# Patient Record
Sex: Male | Born: 1965 | Race: White | Hispanic: No | Marital: Married | Smoking: Current every day smoker
Health system: Southern US, Community
[De-identification: ages and names within clinical notes are randomized; demographics above are authoritative.]

## PROBLEM LIST (undated history)

## (undated) DIAGNOSIS — K649 Unspecified hemorrhoids: Secondary | ICD-10-CM

## (undated) DIAGNOSIS — Z9189 Other specified personal risk factors, not elsewhere classified: Secondary | ICD-10-CM

## (undated) DIAGNOSIS — A630 Anogenital (venereal) warts: Secondary | ICD-10-CM

## (undated) DIAGNOSIS — K219 Gastro-esophageal reflux disease without esophagitis: Secondary | ICD-10-CM

## (undated) DIAGNOSIS — F1020 Alcohol dependence, uncomplicated: Secondary | ICD-10-CM

## (undated) DIAGNOSIS — K921 Melena: Secondary | ICD-10-CM

## (undated) DIAGNOSIS — B019 Varicella without complication: Secondary | ICD-10-CM

## (undated) DIAGNOSIS — M1990 Unspecified osteoarthritis, unspecified site: Secondary | ICD-10-CM

## (undated) HISTORY — DX: Unspecified hemorrhoids: K64.9

## (undated) HISTORY — PX: CHOLECYSTECTOMY, LAPAROSCOPIC: SHX56

## (undated) HISTORY — DX: Unspecified osteoarthritis, unspecified site: M19.90

## (undated) HISTORY — DX: Anogenital (venereal) warts: A63.0

## (undated) HISTORY — DX: Other specified personal risk factors, not elsewhere classified: Z91.89

## (undated) HISTORY — PX: OTHER SURGICAL HISTORY: SHX169

## (undated) HISTORY — PX: BACK SURGERY: SHX140

## (undated) HISTORY — DX: Gastro-esophageal reflux disease without esophagitis: K21.9

## (undated) HISTORY — DX: Varicella without complication: B01.9

## (undated) HISTORY — DX: Alcohol dependence, uncomplicated: F10.20

## (undated) HISTORY — DX: Melena: K92.1

---

## 2005-04-21 ENCOUNTER — Ambulatory Visit: Payer: Self-pay | Admitting: *Deleted

## 2006-03-12 ENCOUNTER — Inpatient Hospital Stay (HOSPITAL_COMMUNITY): Admission: RE | Admit: 2006-03-12 | Discharge: 2006-03-13 | Payer: Self-pay | Admitting: Specialist

## 2006-09-10 IMAGING — CR DG LUMBAR SPINE 2-3V
2 series · 2 of 2 positions shown · non-contrast
Comparison: none

CLINICAL DATA: Back pain.  Preop respiratory exam. 
 LUMBAR SPINE ? 2 VIEW: 
 Correlation is made with chest radiographs done the same date.  There are no other prior studies available for correlation.

[t l-spine a.p.]
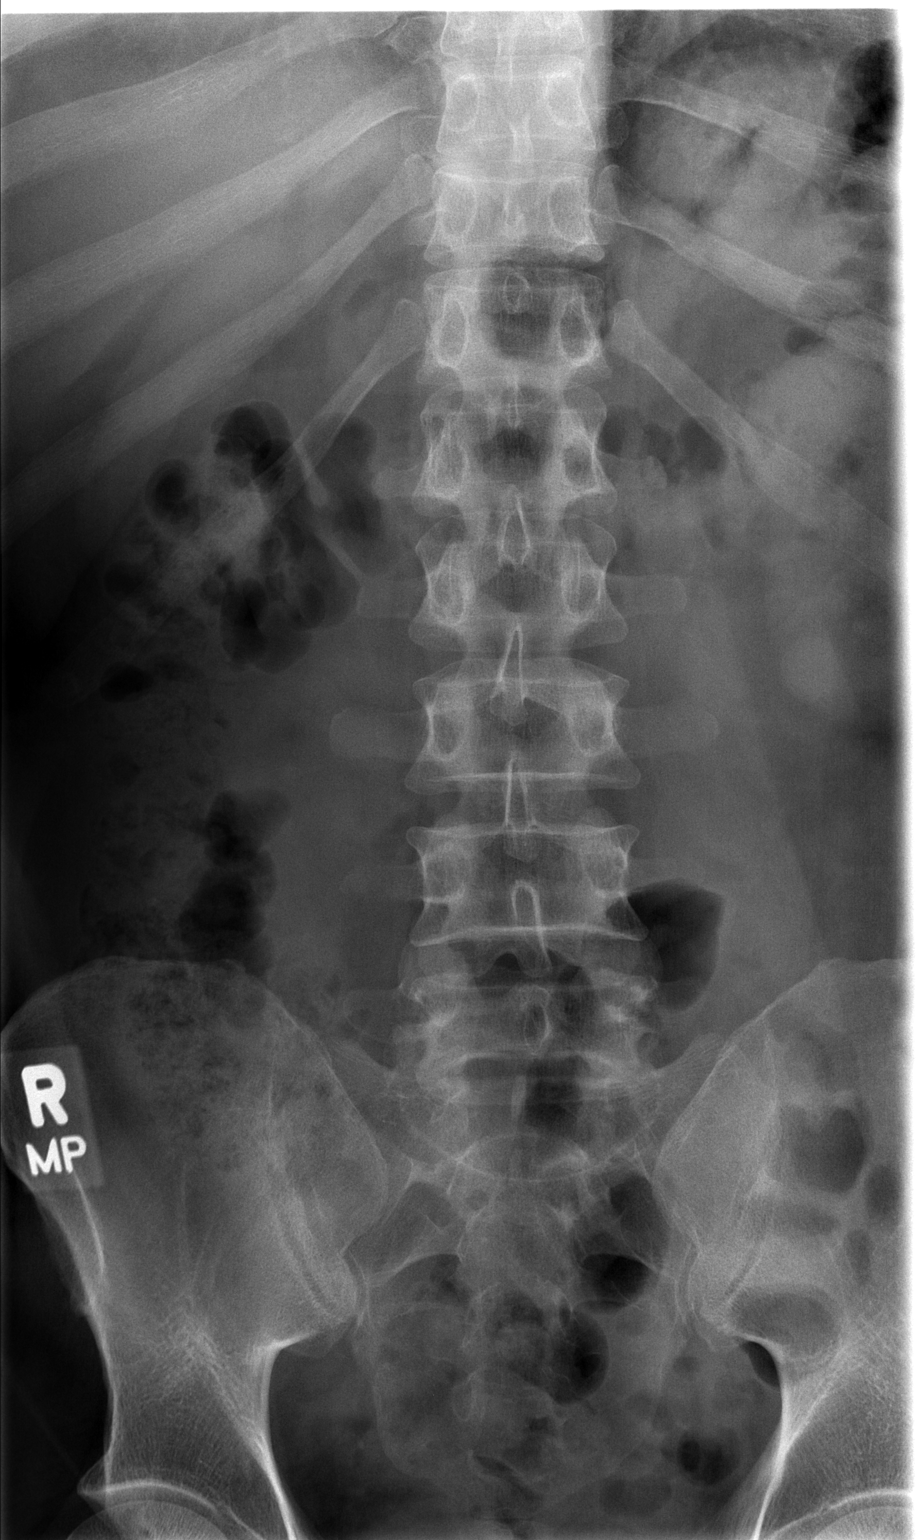

[t l-spine lat]
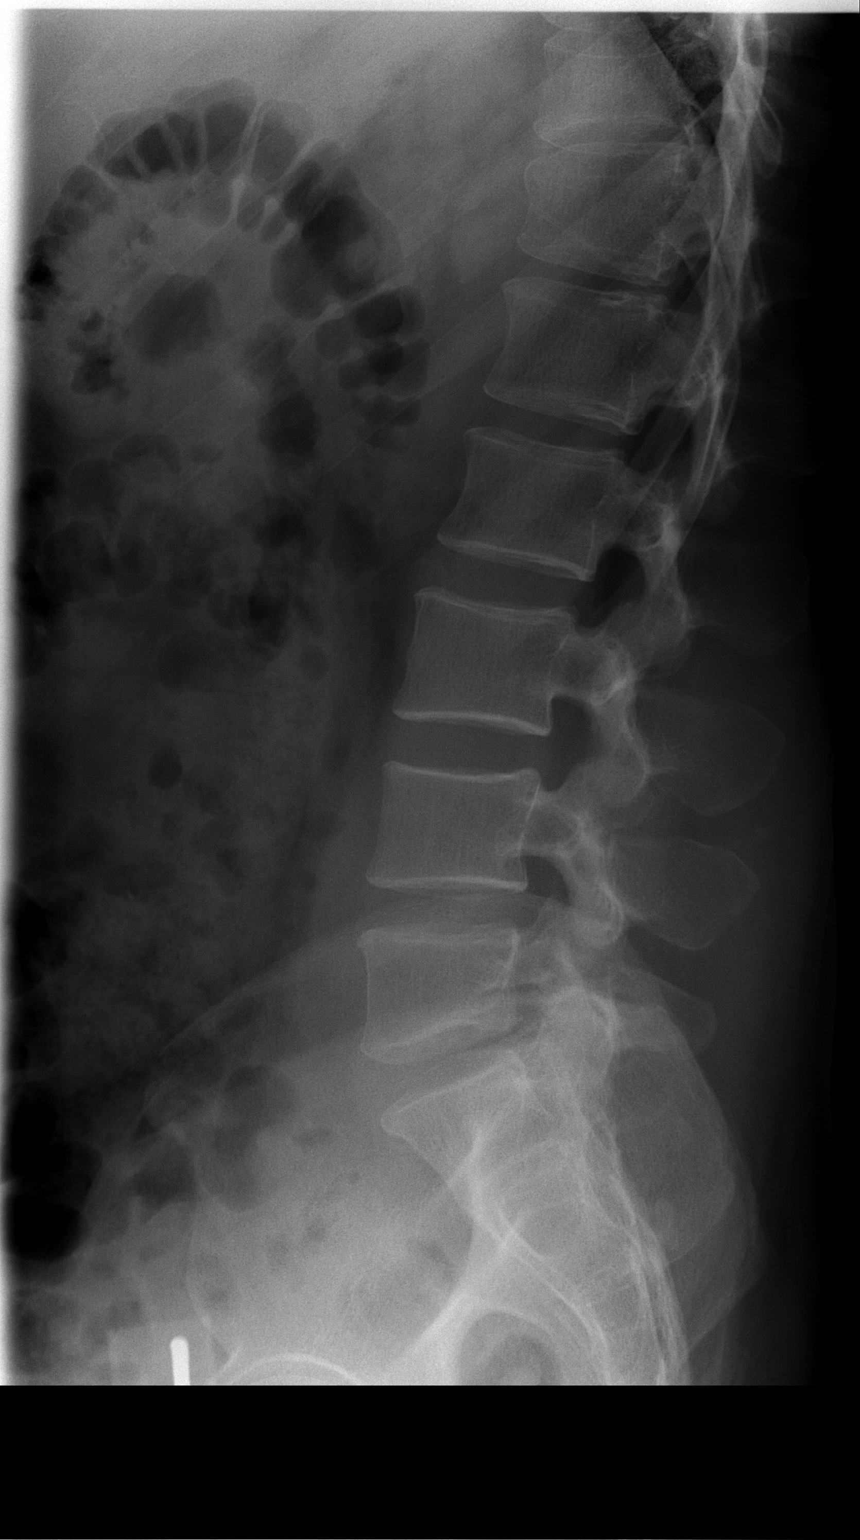

[2 of 2 positions shown; findings below may reference images not displayed]

FINDINGS: The chest radiographs demonstrate 12 rib-bearing thoracic type vertebral bodies.  There are 5 lumbar type vertebral bodies in anatomic alignment.  The disc spaces are preserved.  No focal osseous abnormalities are seen.
IMPRESSION: Normal alignment.  No acute findings.

## 2006-09-12 IMAGING — CR DG LUMBAR SPINE 2-3V
2 series · 2 of 2 positions shown · non-contrast
Comparison: 03/09/06

CLINICAL DATA: HNP L4 through S1 on the left.  
 LUMBAR SPINE ? 2 VIEW:

[view not recorded (1 of 2)]
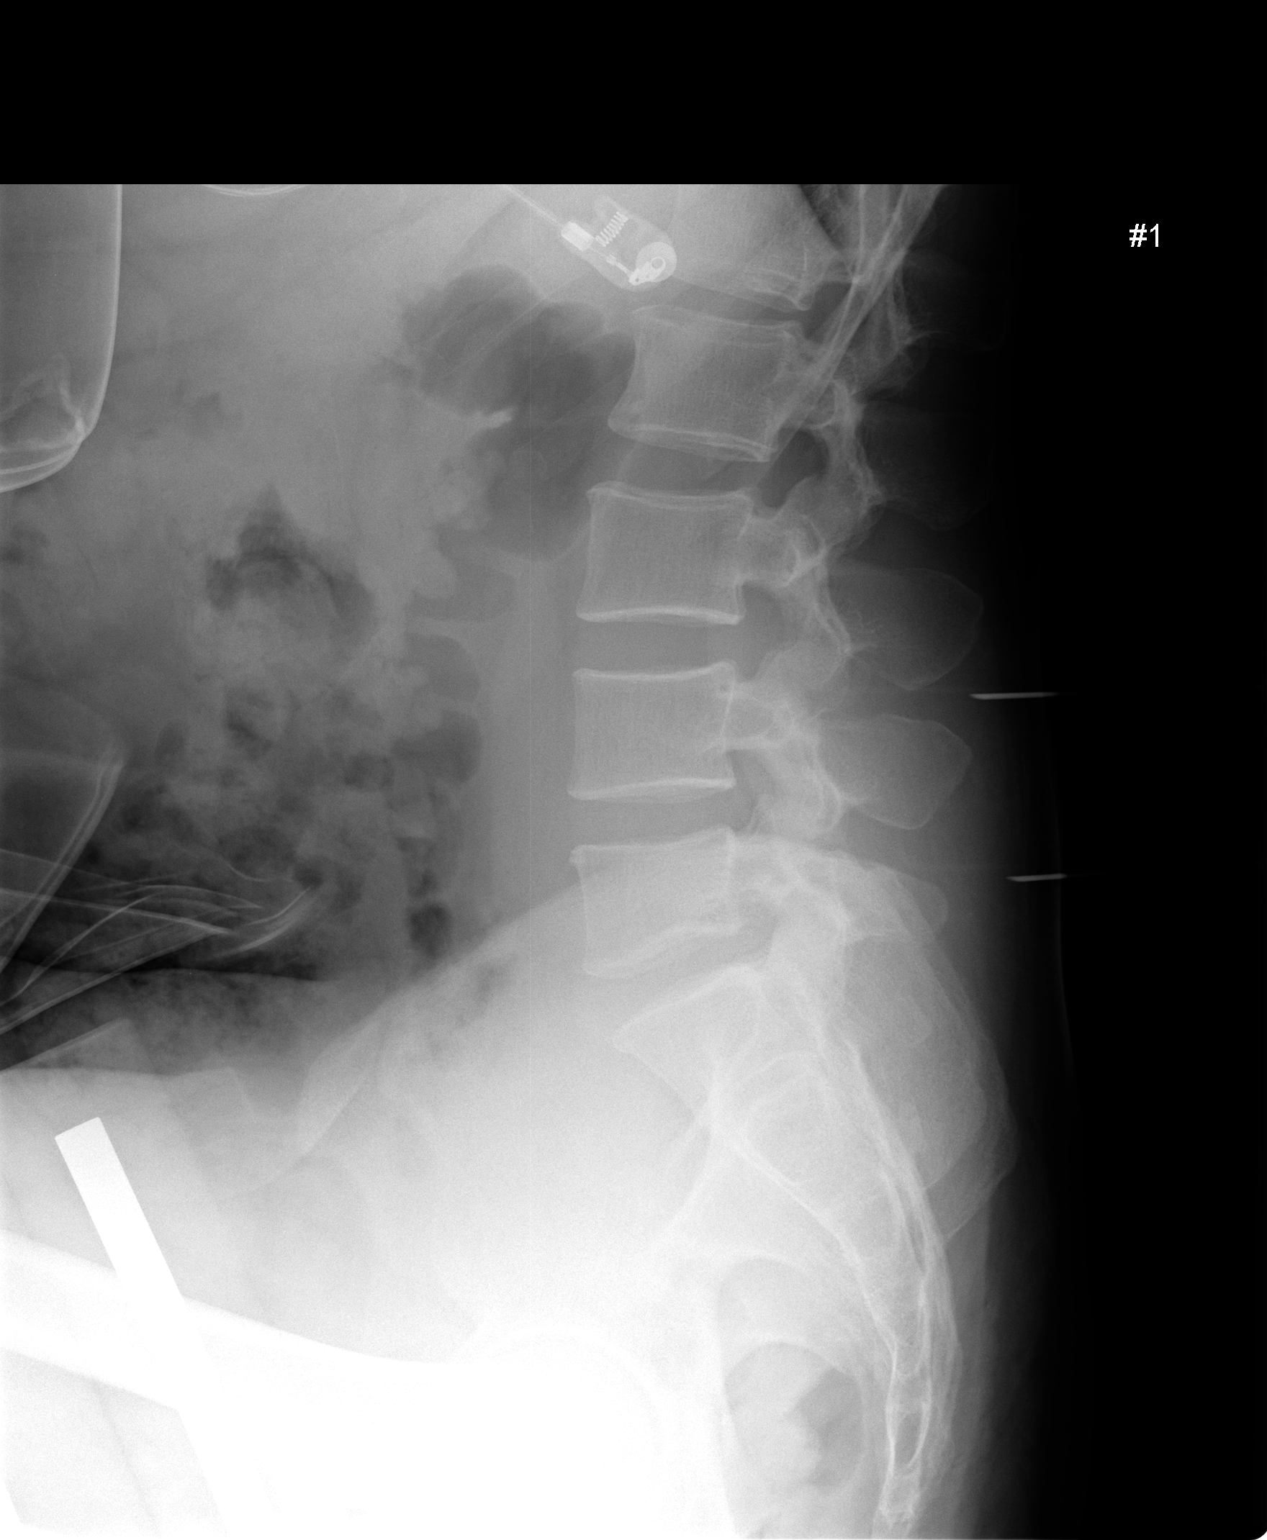

[view not recorded (2 of 2)]
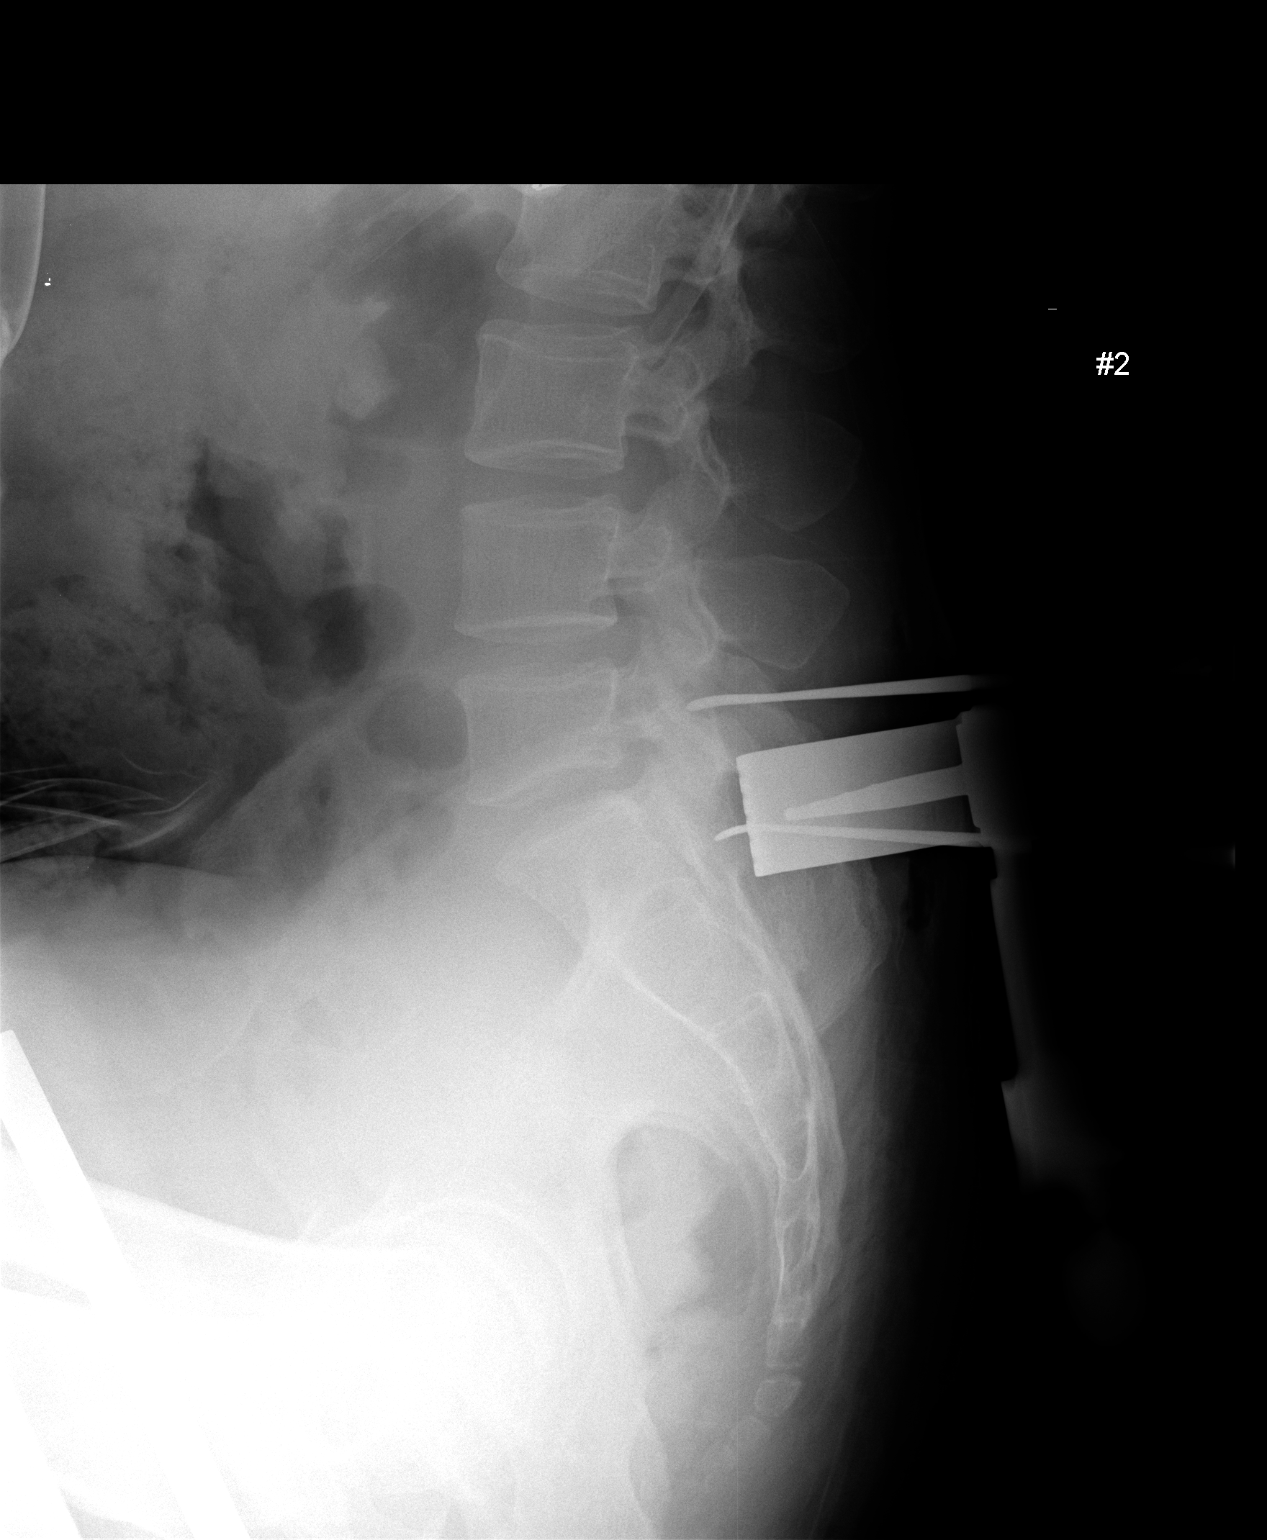

[2 of 2 positions shown; findings below may reference images not displayed]

FINDINGS: On image labeled # 1 surgical probes are identified posterior to the L3-4 disc space and posterior to the L5 vertebral body.  Subsequent image shows tissue spreaders posterior to the L5-S1 disc space.  There is a surgical probe posterior to L5 and probes posterior to S1.
IMPRESSION: Portable radiographs obtained in the operating room for surgical probe localization.

## 2008-05-10 ENCOUNTER — Emergency Department: Payer: Self-pay | Admitting: Emergency Medicine

## 2009-01-13 ENCOUNTER — Inpatient Hospital Stay: Payer: Self-pay | Admitting: Internal Medicine

## 2009-01-16 ENCOUNTER — Observation Stay: Payer: Self-pay | Admitting: General Surgery

## 2014-10-11 ENCOUNTER — Emergency Department: Payer: Self-pay | Admitting: Emergency Medicine

## 2020-01-23 ENCOUNTER — Other Ambulatory Visit: Payer: Self-pay

## 2020-01-23 ENCOUNTER — Encounter: Payer: Self-pay | Admitting: Emergency Medicine

## 2020-01-23 ENCOUNTER — Emergency Department
Admission: EM | Admit: 2020-01-23 | Discharge: 2020-01-23 | Disposition: A | Discharge status: Home / Self Care | Payer: Self-pay | Attending: Emergency Medicine | Admitting: Emergency Medicine

## 2020-01-23 DIAGNOSIS — K641 Second degree hemorrhoids: Secondary | ICD-10-CM | POA: Insufficient documentation

## 2020-01-23 DIAGNOSIS — F17210 Nicotine dependence, cigarettes, uncomplicated: Secondary | ICD-10-CM | POA: Insufficient documentation

## 2020-01-23 DIAGNOSIS — Z5320 Procedure and treatment not carried out because of patient's decision for unspecified reasons: Secondary | ICD-10-CM | POA: Insufficient documentation

## 2020-01-23 MED ORDER — LIDOCAINE-EPINEPHRINE-TETRACAINE (LET) TOPICAL GEL
3.0000 mL | Freq: Once | TOPICAL | Status: AC
  Administered 2020-01-23: 3 mL via TOPICAL
  Filled 2020-01-23: qty 3

## 2020-01-23 MED ORDER — SENNOSIDES-DOCUSATE SODIUM 8.6-50 MG PO TABS: 2.0000 | ORAL_TABLET | Freq: Every day | ORAL | 0 refills | Status: DC | PRN

## 2020-01-23 MED ORDER — HYDROCORT-PRAMOXINE (PERIANAL) 1-1 % EX FOAM: 1.0000 | Freq: Two times a day (BID) | CUTANEOUS | 0 refills | Status: DC

## 2020-01-23 MED ORDER — LIDOCAINE-EPINEPHRINE 1 %-1:100000 IJ SOLN
10.0000 mL | Freq: Once | INTRAMUSCULAR | Status: AC
  Administered 2020-01-23: 10 mL
  Filled 2020-01-23: qty 1

## 2020-01-23 MED ORDER — OXYCODONE-ACETAMINOPHEN 7.5-325 MG PO TABS: 1.0000 | ORAL_TABLET | Freq: Four times a day (QID) | ORAL | 0 refills | Status: DC | PRN

## 2020-01-23 MED ORDER — LIDOCAINE HCL URETHRAL/MUCOSAL 2 % EX GEL: 1.0000 "application " | Freq: Once | CUTANEOUS | Status: DC

## 2020-01-23 NOTE — ED Triage Notes (Signed)
C/O hemorrhoids, rectal pain

## 2020-01-23 NOTE — ED Provider Notes (Signed)
[facility] Emergency Department Provider Note   ____________________________________________   First MD Initiated Contact with Patient 01/23/20 [phone]     (approximate)  I have reviewed the triage vital signs and the nursing notes.   HISTORY  Chief Complaint Hemorrhoids    HPI Marcus Valenzuela is a 54 y.o. male patient presents with protruding hemorrhoids and rectal pain.  Patient also stated no bowel movements for few days.  States last bowel movement negative for bloody stools or observed blood on tissue paper.     History reviewed. No pertinent past medical history.  There are no problems to display for this patient.   Past Surgical History:  Procedure Laterality Date  . BACK SURGERY      Prior to Admission medications   Medication Sig Start Date End Date Taking? Authorizing Provider  hydrocortisone-pramoxine [facility]) rectal foam Place 1 applicator rectally 2 (two) times daily. 01/23/20   Joni Reining, PA-C  oxyCODONE-acetaminophen (PERCOCET) 7.5-325 MG tablet Take 1 tablet by mouth every 6 (six) hours as needed for severe pain. 01/23/20   Joni Reining, PA-C  senna-docusate (SENOKOT-S) 8.6-50 MG tablet Take 2 tablets by mouth daily as needed for mild constipation. 01/23/20   Joni Reining, PA-C    Allergies Patient has no known allergies.  No family history on file.  Social History Social History   Tobacco Use  . Smoking status: Current Every Day Smoker    Types: Cigarettes  . Smokeless tobacco: Never Used  Substance Use Topics  . Alcohol use: Not on file  . Drug use: Not on file    Review of Systems Constitutional: No fever/chills Eyes: No visual changes. ENT: No sore throat. Cardiovascular: Denies chest pain. Respiratory: Denies shortness of breath. Gastrointestinal: No abdominal pain.  No nausea, no vomiting.  No diarrhea.  States constipation. Genitourinary: Negative for dysuria. Musculoskeletal:  Negative for back pain. Skin: Negative for rash. Neurological: Negative for headaches, focal weakness or numbness.   ____________________________________________   PHYSICAL EXAM:  VITAL SIGNS: ED Triage Vitals  Enc Vitals Group     BP 01/23/20 0806 (!) 149/98     Pulse Rate 01/23/20 0806 71     Resp --      Temp 01/23/20 0806 98 F (36.7 C)     Temp Source 01/23/20 0806 Oral     SpO2 01/23/20 0806 98 %     Weight 01/23/20 0800 150 lb (68 kg)     Height 01/23/20 0800 5\' 8"  (1.727 m)     Head Circumference --      Peak Flow --      Pain Score 01/23/20 0800 8     Pain Loc --      Pain Edu? --      Excl. in GC? --    Constitutional: Alert and oriented. Well appearing and in no acute distress. Cardiovascular: Normal rate, regular rhythm. Grossly normal heart sounds.  Good peripheral circulation. Respiratory: Normal respiratory effort.  No retractions. Lungs CTAB. Gastrointestinal: Soft and nontender. No distention. No abdominal bruits. No CVA tenderness.  Rectal nonthrombosed hemorrhoid.  ____________________________________________   LABS (all labs ordered are listed, but only abnormal results are displayed)  Labs Reviewed - No data to display ____________________________________________  EKG   ____________________________________________  RADIOLOGY  ED MD interpretation:    Official radiology report(s): No results found.  ____________________________________________   PROCEDURES  Procedure(s) performed (including Critical Care):  Procedures   ____________________________________________   INITIAL IMPRESSION /  ASSESSMENT AND PLAN / ED COURSE  As part of my medical decision making, I reviewed the following data within the [place]     Patient presents with a large protruding rectal hemorrhoid.  Discussed rationale for for incision. LET was applied to the area.  Patient care was delayed secondary to another patient arriving with   gaping dog bite to the left forearm.  The nurse explained to the patient that I would be delayed as I controlled the bleeding and took care of the wound.  When I arrived back to the room patient was upset on the delay which was less than 1 hour.  Elected not the head procedure performed and stated he would prefer to be seen surgical clinic.  Discharge instructions were given along with contact to the surgical clinic.    Marcus Valenzuela was evaluated in Emergency Department on 01/23/2020 for the symptoms described in the history of present illness. He was evaluated in the context of the global COVID-19 pandemic, which necessitated consideration that the patient might be at risk for infection with the SARS-CoV-2 virus that causes COVID-19. Institutional protocols and algorithms that pertain to the evaluation of patients at risk for COVID-19 are in a state of rapid change based on information released by regulatory bodies including the CDC and federal and state organizations. These policies and algorithms were followed during the patient's care in the ED.        ____________________________________________   FINAL CLINICAL IMPRESSION(S) / ED DIAGNOSES  Final diagnoses:  Grade II hemorrhoids     ED Discharge Orders         Ordered    hydrocortisone-pramoxine [facility]) rectal foam  2 times daily     Discontinue  Reprint     01/23/20 0958    senna-docusate (SENOKOT-S) 8.6-50 MG tablet  Daily PRN     Discontinue  Reprint     01/23/20 0958    oxyCODONE-acetaminophen (PERCOCET) 7.5-325 MG tablet  Every 6 hours PRN     Discontinue  Reprint     01/23/20 0958           Note:  This document was prepared using Dragon voice recognition software and may include unintentional dictation errors.    Sable Feil, PA-C 01/23/20 1003    Arta Silence, MD 01/23/20 1115

## 2020-01-23 NOTE — Discharge Instructions (Signed)
Follow discharge care instruction take medication as directed.  Contact the surgical clinic today to schedule appointment for definitive evaluation and treatment.

## 2020-01-23 NOTE — ED Notes (Signed)
See triage note  Presents with pain to rectal area  Hx of hemorrhoids   Area is getting worse

## 2020-01-26 ENCOUNTER — Ambulatory Visit: Payer: Self-pay | Admitting: Surgery

## 2020-01-26 ENCOUNTER — Encounter: Payer: Self-pay | Admitting: Surgery

## 2020-01-26 ENCOUNTER — Other Ambulatory Visit
Admission: RE | Admit: 2020-01-26 | Discharge: 2020-01-26 | Disposition: A | Discharge status: Home / Self Care | Payer: HRSA Program | Source: Ambulatory Visit | Attending: Surgery | Admitting: Surgery

## 2020-01-26 ENCOUNTER — Other Ambulatory Visit: Payer: Self-pay

## 2020-01-26 ENCOUNTER — Telehealth: Payer: Self-pay | Admitting: Surgery

## 2020-01-26 DIAGNOSIS — Z20822 Contact with and (suspected) exposure to covid-19: Secondary | ICD-10-CM | POA: Diagnosis not present

## 2020-01-26 DIAGNOSIS — Z01812 Encounter for preprocedural laboratory examination: Secondary | ICD-10-CM | POA: Diagnosis present

## 2020-01-26 DIAGNOSIS — K648 Other hemorrhoids: Secondary | ICD-10-CM

## 2020-01-26 DIAGNOSIS — K645 Perianal venous thrombosis: Secondary | ICD-10-CM

## 2020-01-26 LAB — SARS CORONAVIRUS 2 (TAT 6-24 HRS): SARS Coronavirus 2: NEGATIVE

## 2020-01-26 NOTE — Telephone Encounter (Signed)
Pt has been advised of Pre-Admission date/time, COVID Testing date and Surgery date.  Surgery Date: 01/30/20 Preadmission Testing Date: 01/30/20 (office, patient to arrive at 6:00 am per pre-admissions) Covid Testing Date: 01/26/20 - patient [facility] [facility]) between 8a-1p  Patient has been made aware to arrive at 6:00 am at [facility] date of his surgery 01/30/20.  Patient voices understanding.
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
## 2020-01-26 NOTE — Patient Instructions (Addendum)
Our surgery scheduler will call you within 24-48 hours to schedule your surgery.   Please have the Blue surgery sheet available when speaking with her. You will need to take a Fleets enema the morning of surgery. This can be purchased at [facility] or any drug store. Increase water intake daily.   Please continue with sitz baths.   How to Take a [employer] A sitz bath is a warm water bath that may be used to care for your rectum, genital area, or the area between your rectum and genitals (perineum). For a sitz bath, the water only comes up to your hips and covers your buttocks. A sitz bath may done at home in a bathtub or with a portable sitz bath that fits over the toilet. Your health care provider may recommend a sitz bath to help:  Relieve pain and discomfort after delivering a baby.  Relieve pain and itching from hemorrhoids or anal fissures.  Relieve pain after certain surgeries.  Relax muscles that are sore or tight. How to take a sitz bath Take 3-4 sitz baths a day, or as many as told by your health care provider. Bathtub sitz bath To take a sitz bath in a bathtub: 1. Partially fill a bathtub with warm water. The water should be deep enough to cover your hips and buttocks when you are sitting in the tub. 2. If your health care provider told you to put medicine in the water, follow his or her instructions. 3. Sit in the water. 4. Open the tub drain a little, and leave it open during your bath. 5. Turn on the warm water again, enough to replace the water that is draining out. Keep the water running throughout your bath. This helps keep the water at the right level and the right temperature. 6. Soak in the water for 15-20 minutes, or as long as told by your health care provider. 7. When you are done, be careful when you stand up. You may feel dizzy. 8. After the sitz bath, pat yourself dry. Do not rub your skin to dry it.  Over-the-toilet sitz bath To take a sitz bath with an  over-the-toilet basin: 1. Follow the manufacturer's instructions. 2. Fill the basin with warm water. 3. If your health care provider told you to put medicine in the water, follow his or her instructions. 4. Sit on the seat. Make sure the water covers your buttocks and perineum. 5. Soak in the water for 15-20 minutes, or as long as told by your health care provider. 6. After the sitz bath, pat yourself dry. Do not rub your skin to dry it. 7. Clean and dry the basin between uses. 8. Discard the basin if it cracks, or according to the manufacturer's instructions. Contact a health care provider if:  Your symptoms get worse. Do not continue with sitz baths if your symptoms get worse.  You have new symptoms. If this happens, do not continue with sitz baths until you talk with your health care provider. Summary  A sitz bath is a warm water bath in which the water only comes up to your hips and covers your buttocks.  A sitz bath may help relieve itching, relieve pain, and relax muscles that are sore or tight in the lower part of your body, including your genital area.  Take 3-4 sitz baths a day, or as many as told by your health care provider. Soak in the water for 15-20 minutes.  Do not continue with sitz  baths if your symptoms get worse. This information is not intended to replace advice given to you by your health care provider. Make sure you discuss any questions you have with your health care provider. Document Revised: 12/27/2018 Document Reviewed: 07/30/2017 Elsevier Patient Education  2020 [employer].    Hemorrhoids Hemorrhoids are swollen veins that may develop:  In the butt (rectum). These are called internal hemorrhoids.  Around the opening of the butt (anus). These are called external hemorrhoids. Hemorrhoids can cause pain, itching, or bleeding. Most of the time, they do not cause serious problems. They usually get better with diet changes, lifestyle changes, and other home  treatments. What are the causes? This condition may be caused by:  Having trouble pooping (constipation).  Pushing hard (straining) to poop.  Watery poop (diarrhea).  Pregnancy.  Being very overweight (obese).  Sitting for long periods of time.  Heavy lifting or other activity that causes you to strain.  Anal sex.  Riding a bike for a long period of time. What are the signs or symptoms? Symptoms of this condition include:  Pain.  Itching or soreness in the butt.  Bleeding from the butt.  Leaking poop.  Swelling in the area.  One or more lumps around the opening of your butt. How is this diagnosed? A doctor can often diagnose this condition by looking at the affected area. The doctor may also:  Do an exam that involves feeling the area with a gloved hand (digital rectal exam).  Examine the area inside your butt using a small tube (anoscope).  Order blood tests. This may be done if you have lost a lot of blood.  Have you get a test that involves looking inside the colon using a flexible tube with a camera on the end (sigmoidoscopy or colonoscopy). How is this treated? This condition can usually be treated at home. Your doctor may tell you to change what you eat, make lifestyle changes, or try home treatments. If these do not help, procedures can be done to remove the hemorrhoids or make them smaller. These may involve:  Placing rubber bands at the base of the hemorrhoids to cut off their blood supply.  Injecting medicine into the hemorrhoids to shrink them.  Shining a type of light energy onto the hemorrhoids to cause them to fall off.  Doing surgery to remove the hemorrhoids or cut off their blood supply. Follow these instructions at home: Eating and drinking   Eat foods that have a lot of fiber in them. These include whole grains, beans, nuts, fruits, and vegetables.  Ask your doctor about taking products that have added fiber (fibersupplements).  Reduce  the amount of fat in your diet. You can do this by: ? Eating low-fat dairy products. ? Eating less red meat. ? Avoiding processed foods.  Drink enough fluid to keep your pee (urine) pale yellow. Managing pain and swelling   Take a warm-water bath (sitz bath) for 20 minutes to ease pain. Do this 3-4 times a day. You may do this in a bathtub or using a portable sitz bath that fits over the toilet.  If told, put ice on the painful area. It may be helpful to use ice between your warm baths. ? Put ice in a plastic bag. ? Place a towel between your skin and the bag. ? Leave the ice on for 20 minutes, 2-3 times a day. General instructions  Take over-the-counter and prescription medicines only as told by your doctor. ?  Medicated creams and medicines may be used as told.  Exercise often. Ask your doctor how much and what kind of exercise is best for you.  Go to the bathroom when you have the urge to poop. Do not wait.  Avoid pushing too hard when you poop.  Keep your butt dry and clean. Use wet toilet paper or moist towelettes after pooping.  Do not sit on the toilet for a long time.  Keep all follow-up visits as told by your doctor. This is important. Contact a doctor if you:  Have pain and swelling that do not get better with treatment or medicine.  Have trouble pooping.  Cannot poop.  Have pain or swelling outside the area of the hemorrhoids. Get help right away if you have:  Bleeding that will not stop. Summary  Hemorrhoids are swollen veins in the butt or around the opening of the butt.  They can cause pain, itching, or bleeding.  Eat foods that have a lot of fiber in them. These include whole grains, beans, nuts, fruits, and vegetables.  Take a warm-water bath (sitz bath) for 20 minutes to ease pain. Do this 3-4 times a day. This information is not intended to replace advice given to you by your health care provider. Make sure you discuss any questions you have with  your health care provider. Document Revised: 08/05/2018 Document Reviewed: 12/17/2017 Elsevier Patient Education  2020 Elsevier Inc.  [employer] Content in Foods  See the following list for the dietary fiber content of some common foods. High-fiber foods High-fiber foods contain 4 grams or more (4g or more) of fiber per serving. They include:  Artichoke (fresh) -- 1 medium has 10.3g of fiber.  Baked beans, plain or vegetarian (canned) --  cup has 5.2g of fiber.  Blackberries or raspberries (fresh) --  cup has 4g of fiber.  Bran cereal --  cup has 8.6g of fiber.  Bulgur (cooked) --  cup has 4g of fiber.  Kidney beans (canned) --  cup has 6.8g of fiber.  Lentils (cooked) --  cup has 7.8g of fiber.  Pear (fresh) -- 1 medium has 5.1g of fiber.  Peas (frozen) --  cup has 4.4g of fiber.  Pinto beans (canned) --  cup has 5.5g of fiber.  Pinto beans (dried and cooked) --  cup has 7.7g of fiber.  Potato with skin (baked) -- 1 medium has 4.4g of fiber.  Quinoa (cooked) --  cup has 5g of fiber.  Soybeans (canned, frozen, or fresh) --  cup has 5.1g of fiber. Moderate-fiber foods Moderate-fiber foods contain 1-4 grams (1-4g) of fiber per serving. They include:  Almonds -- 1 oz. has 3.5g of fiber.  Apple with skin -- 1 medium has 3.3g of fiber.  Applesauce, sweetened --  cup has 1.5g of fiber.  Bagel, plain -- one 4-inch (10-cm) bagel has 2g of fiber.  Banana -- 1 medium has 3.1g of fiber.  Broccoli (cooked) --  cup has 2.5g of fiber.  Carrots (cooked) --  cup has 2.3g of fiber.  Corn (canned or frozen) --  cup has 2.1g of fiber.  Corn tortilla -- one 6-inch (15-cm) tortilla has 1.5g of fiber.  Green beans (canned) --  cup has 2g of fiber.  Instant oatmeal --  cup has about 2g of fiber.  Long-grain brown rice (cooked) -- 1 cup has 3.5g of fiber.  Macaroni, enriched (cooked) -- 1 cup has 2.5g of fiber.  Melon -- 1 cup has 1.4g of  fiber.  Multigrain  cereal --  cup has about 2-4g of fiber.  Orange -- 1 small has 3.1g of fiber.  Potatoes, mashed --  cup has 1.6g of fiber.  Raisins -- 1/4 cup has 1.6g of fiber.  Squash --  cup has 2.9g of fiber.  Sunflower seeds --  cup has 1.1g of fiber.  Tomato -- 1 medium has 1.5g of fiber.  Vegetable or soy patty -- 1 has 3.4g of fiber.  Whole-wheat bread -- 1 slice has 2g of fiber.  Whole-wheat spaghetti --  cup has 3.2g of fiber. Low-fiber foods Low-fiber foods contain less than 1 gram (less than 1g) of fiber per serving. They include:  Egg -- 1 large.  Flour tortilla -- one 6-inch (15-cm) tortilla.  Fruit juice --  cup.  Lettuce -- 1 cup.  Meat, poultry, or fish -- 1 oz.  Milk -- 1 cup.  Spinach (raw) -- 1 cup.  White bread -- 1 slice.  White rice --  cup.  Yogurt --  cup. Actual amounts of fiber in foods may be different depending on processing. Talk with your dietitian about how much fiber you need in your diet. This information is not intended to replace advice given to you by your health care provider. Make sure you discuss any questions you have with your health care provider. Document Revised: 03/20/2016 Document Reviewed: 09/20/2015 Elsevier Patient Education  [place].

## 2020-01-30 ENCOUNTER — Encounter
Admission: RE | Disposition: A | Discharge status: Home / Self Care | Payer: Self-pay | Source: Home / Self Care | Attending: Surgery

## 2020-01-30 ENCOUNTER — Ambulatory Visit: Payer: Self-pay | Admitting: Anesthesiology

## 2020-01-30 ENCOUNTER — Ambulatory Visit
Admission: RE | Admit: 2020-01-30 | Discharge: 2020-01-30 | Disposition: A | Discharge status: Home / Self Care | Payer: Self-pay | Attending: Surgery | Admitting: Surgery

## 2020-01-30 ENCOUNTER — Encounter: Payer: Self-pay | Admitting: Surgery

## 2020-01-30 ENCOUNTER — Other Ambulatory Visit: Payer: Self-pay

## 2020-01-30 DIAGNOSIS — K644 Residual hemorrhoidal skin tags: Secondary | ICD-10-CM | POA: Insufficient documentation

## 2020-01-30 DIAGNOSIS — K648 Other hemorrhoids: Secondary | ICD-10-CM

## 2020-01-30 DIAGNOSIS — F17210 Nicotine dependence, cigarettes, uncomplicated: Secondary | ICD-10-CM | POA: Insufficient documentation

## 2020-01-30 DIAGNOSIS — Z800 Family history of malignant neoplasm of digestive organs: Secondary | ICD-10-CM | POA: Insufficient documentation

## 2020-01-30 DIAGNOSIS — K642 Third degree hemorrhoids: Secondary | ICD-10-CM | POA: Insufficient documentation

## 2020-01-30 DIAGNOSIS — K645 Perianal venous thrombosis: Secondary | ICD-10-CM

## 2020-01-30 DIAGNOSIS — Z885 Allergy status to narcotic agent status: Secondary | ICD-10-CM | POA: Insufficient documentation

## 2020-01-30 HISTORY — PX: HEMORRHOID SURGERY: SHX153

## 2020-01-30 SURGERY — HEMORRHOIDECTOMY
Anesthesia: General | Laterality: Left

## 2020-01-30 MED ORDER — CHLORHEXIDINE GLUCONATE 0.12 % MT SOLN
15.0000 mL | Freq: Once | OROMUCOSAL | Status: AC
  Administered 2020-01-30: 15 mL via OROMUCOSAL

## 2020-01-30 MED ORDER — PHENYLEPHRINE HCL (PRESSORS) 10 MG/ML IV SOLN
INTRAVENOUS | Status: AC
  Filled 2020-01-30: qty 1

## 2020-01-30 MED ORDER — LACTATED RINGERS IV SOLN: INTRAVENOUS | Status: DC

## 2020-01-30 MED ORDER — LIDOCAINE HCL (PF) 2 % IJ SOLN
INTRAMUSCULAR | Status: AC
  Filled 2020-01-30: qty 5

## 2020-01-30 MED ORDER — PROPOFOL 10 MG/ML IV BOLUS
INTRAVENOUS | Status: AC
  Filled 2020-01-30: qty 20

## 2020-01-30 MED ORDER — MEPERIDINE HCL 50 MG/ML IJ SOLN: 6.2500 mg | INTRAMUSCULAR | Status: DC | PRN

## 2020-01-30 MED ORDER — MIDAZOLAM HCL 2 MG/2ML IJ SOLN
INTRAMUSCULAR | Status: DC | PRN
  Administered 2020-01-30: 2 mg via INTRAVENOUS

## 2020-01-30 MED ORDER — MIDAZOLAM HCL 2 MG/2ML IJ SOLN
INTRAMUSCULAR | Status: AC
  Filled 2020-01-30: qty 4

## 2020-01-30 MED ORDER — CHLORHEXIDINE GLUCONATE CLOTH 2 % EX PADS: 6.0000 | MEDICATED_PAD | Freq: Once | CUTANEOUS | Status: DC

## 2020-01-30 MED ORDER — BUPIVACAINE LIPOSOME 1.3 % IJ SUSP
INTRAMUSCULAR | Status: AC
  Filled 2020-01-30: qty 20

## 2020-01-30 MED ORDER — PHENYLEPHRINE HCL (PRESSORS) 10 MG/ML IV SOLN
INTRAVENOUS | Status: DC | PRN
  Administered 2020-01-30: 200 ug via INTRAVENOUS
  Administered 2020-01-30 (×2): 100 ug via INTRAVENOUS

## 2020-01-30 MED ORDER — CHLORHEXIDINE GLUCONATE 0.12 % MT SOLN
OROMUCOSAL | Status: AC
  Filled 2020-01-30: qty 15

## 2020-01-30 MED ORDER — BUPIVACAINE-EPINEPHRINE (PF) 0.25% -1:200000 IJ SOLN
INTRAMUSCULAR | Status: AC
  Filled 2020-01-30: qty 30

## 2020-01-30 MED ORDER — SUGAMMADEX SODIUM 200 MG/2ML IV SOLN
INTRAVENOUS | Status: DC | PRN
  Administered 2020-01-30: 150 mg via INTRAVENOUS

## 2020-01-30 MED ORDER — POLYETHYLENE GLYCOL 3350 17 G PO PACK: 17.0000 g | PACK | Freq: Every day | ORAL | 0 refills | Status: DC

## 2020-01-30 MED ORDER — METAMUCIL SMOOTH TEXTURE 58.6 % PO POWD
1.0000 | Freq: Three times a day (TID) | ORAL | 12 refills | Status: DC
Start: 2020-01-30 — End: 2021-08-15

## 2020-01-30 MED ORDER — ORAL CARE MOUTH RINSE: 15.0000 mL | Freq: Once | OROMUCOSAL | Status: AC

## 2020-01-30 MED ORDER — ONDANSETRON HCL 4 MG/2ML IJ SOLN
INTRAMUSCULAR | Status: AC
  Filled 2020-01-30: qty 2

## 2020-01-30 MED ORDER — ROCURONIUM BROMIDE 10 MG/ML (PF) SYRINGE
PREFILLED_SYRINGE | INTRAVENOUS | Status: AC
  Filled 2020-01-30: qty 10

## 2020-01-30 MED ORDER — DEXAMETHASONE SODIUM PHOSPHATE 10 MG/ML IJ SOLN
INTRAMUSCULAR | Status: DC | PRN
  Administered 2020-01-30: 10 mg via INTRAVENOUS

## 2020-01-30 MED ORDER — BUPIVACAINE LIPOSOME 1.3 % IJ SUSP
INTRAMUSCULAR | Status: DC | PRN
  Administered 2020-01-30: 20 mL

## 2020-01-30 MED ORDER — FENTANYL CITRATE (PF) 100 MCG/2ML IJ SOLN
INTRAMUSCULAR | Status: AC
  Filled 2020-01-30: qty 2

## 2020-01-30 MED ORDER — PROMETHAZINE HCL 25 MG/ML IJ SOLN: 6.2500 mg | INTRAMUSCULAR | Status: DC | PRN

## 2020-01-30 MED ORDER — FENTANYL CITRATE (PF) 100 MCG/2ML IJ SOLN: 25.0000 ug | INTRAMUSCULAR | Status: DC | PRN

## 2020-01-30 MED ORDER — FENTANYL CITRATE (PF) 100 MCG/2ML IJ SOLN
INTRAMUSCULAR | Status: DC | PRN
  Administered 2020-01-30 (×2): 50 ug via INTRAVENOUS

## 2020-01-30 MED ORDER — BUPIVACAINE-EPINEPHRINE 0.25% -1:200000 IJ SOLN
INTRAMUSCULAR | Status: DC | PRN
  Administered 2020-01-30: 30 mL

## 2020-01-30 MED ORDER — LIDOCAINE HCL (CARDIAC) PF 100 MG/5ML IV SOSY
PREFILLED_SYRINGE | INTRAVENOUS | Status: DC | PRN
  Administered 2020-01-30: 80 mg via INTRAVENOUS

## 2020-01-30 MED ORDER — OXYCODONE-ACETAMINOPHEN 7.5-325 MG PO TABS: 1.0000 | ORAL_TABLET | ORAL | 0 refills | Status: AC | PRN

## 2020-01-30 MED ORDER — FLEET ENEMA 7-19 GM/118ML RE ENEM: 1.0000 | ENEMA | Freq: Once | RECTAL | Status: DC

## 2020-01-30 MED ORDER — EPHEDRINE SULFATE 50 MG/ML IJ SOLN
INTRAMUSCULAR | Status: DC | PRN
  Administered 2020-01-30: 5 mg via INTRAVENOUS

## 2020-01-30 MED ORDER — GELATIN ABSORBABLE 100 CM EX MISC
CUTANEOUS | Status: AC
  Filled 2020-01-30: qty 1

## 2020-01-30 MED ORDER — OXYCODONE HCL 5 MG PO TABS
ORAL_TABLET | ORAL | Status: AC
  Filled 2020-01-30: qty 1

## 2020-01-30 MED ORDER — IBUPROFEN 800 MG PO TABS: 800.0000 mg | ORAL_TABLET | Freq: Three times a day (TID) | ORAL | 0 refills | Status: DC | PRN

## 2020-01-30 MED ORDER — DIBUCAINE (PERIANAL) 1 % EX OINT
TOPICAL_OINTMENT | CUTANEOUS | Status: AC
  Filled 2020-01-30: qty 28

## 2020-01-30 MED ORDER — ROCURONIUM BROMIDE 100 MG/10ML IV SOLN
INTRAVENOUS | Status: DC | PRN
  Administered 2020-01-30: 50 mg via INTRAVENOUS

## 2020-01-30 MED ORDER — OXYCODONE HCL 5 MG PO TABS
5.0000 mg | ORAL_TABLET | Freq: Once | ORAL | Status: AC | PRN
  Administered 2020-01-30: 5 mg via ORAL

## 2020-01-30 MED ORDER — PROPOFOL 10 MG/ML IV BOLUS
INTRAVENOUS | Status: DC | PRN
  Administered 2020-01-30: 150 mg via INTRAVENOUS

## 2020-01-30 MED ORDER — FLEET ENEMA 7-19 GM/118ML RE ENEM
1.0000 | ENEMA | Freq: Once | RECTAL | Status: AC
  Administered 2020-01-30: 1 via RECTAL

## 2020-01-30 MED ORDER — OXYCODONE HCL 5 MG/5ML PO SOLN: 5.0000 mg | Freq: Once | ORAL | Status: AC | PRN

## 2020-01-30 MED ORDER — EPHEDRINE 5 MG/ML INJ
INTRAVENOUS | Status: AC
  Filled 2020-01-30: qty 10

## 2020-01-30 MED ORDER — DEXAMETHASONE SODIUM PHOSPHATE 10 MG/ML IJ SOLN
INTRAMUSCULAR | Status: AC
  Filled 2020-01-30: qty 1

## 2020-01-30 MED ORDER — ONDANSETRON HCL 4 MG/2ML IJ SOLN
INTRAMUSCULAR | Status: DC | PRN
  Administered 2020-01-30: 4 mg via INTRAVENOUS

## 2020-01-30 SURGICAL SUPPLY — 26 items
BLADE SURG 15 STRL LF DISP TIS (BLADE) ×1 IMPLANT
BLADE SURG 15 STRL SS (BLADE) ×3
BRIEF STRETCH MATERNITY 2XLG (MISCELLANEOUS) ×3 IMPLANT
CANISTER SUCT 1200ML W/VALVE (MISCELLANEOUS) ×3 IMPLANT
COVER WAND RF STERILE (DRAPES) ×3 IMPLANT
DRAPE LAPAROTOMY 100X77 ABD (DRAPES) ×3 IMPLANT
DRSG GAUZE FLUFF 36X18 (GAUZE/BANDAGES/DRESSINGS) ×3 IMPLANT
ELECT CAUTERY BLADE TIP 2.5 (TIP) ×3
ELECT REM PT RETURN 9FT ADLT (ELECTROSURGICAL) ×3
ELECTRODE CAUTERY BLDE TIP 2.5 (TIP) ×1 IMPLANT
ELECTRODE REM PT RTRN 9FT ADLT (ELECTROSURGICAL) ×1 IMPLANT
GLOVE ORTHO TXT STRL SZ7.5 (GLOVE) ×3 IMPLANT
GOWN STRL REUS W/ TWL LRG LVL3 (GOWN DISPOSABLE) ×2 IMPLANT
GOWN STRL REUS W/TWL LRG LVL3 (GOWN DISPOSABLE) ×9
KIT TURNOVER KIT A (KITS) ×3 IMPLANT
NEEDLE HYPO 22GX1.5 SAFETY (NEEDLE) ×3 IMPLANT
PACK BASIN MINOR (MISCELLANEOUS) ×3 IMPLANT
PAD ABD DERMACEA PRESS 5X9 (GAUZE/BANDAGES/DRESSINGS) IMPLANT
SHEARS HARMONIC 9CM CVD (BLADE) IMPLANT
SOL PREP PVP 2OZ (MISCELLANEOUS) ×3
SOLUTION PREP PVP 2OZ (MISCELLANEOUS) ×1 IMPLANT
SURGILUBE 2OZ TUBE FLIPTOP (MISCELLANEOUS) ×3 IMPLANT
SUT CHROMIC 3 0 SH 27 (SUTURE) ×3 IMPLANT
SWABSTK COMLB BENZOIN TINCTURE (MISCELLANEOUS) ×3 IMPLANT
SYR 10ML LL (SYRINGE) ×3 IMPLANT
TAPE CLOTH 3X10 WHT NS LF (GAUZE/BANDAGES/DRESSINGS) ×4 IMPLANT

## 2020-01-30 NOTE — Anesthesia Preprocedure Evaluation (Signed)
Anesthesia Evaluation  Patient identified by MRN, date of birth, ID band Patient awake    Reviewed: Allergy & Precautions, NPO status , Patient's Chart, lab work & pertinent test results  History of Anesthesia Complications Negative for: history of anesthetic complications  Airway Mallampati: II  TM Distance: >3 FB Neck ROM: Full    Dental  (+) Edentulous Upper, Missing   Pulmonary neg sleep apnea, neg COPD, Current Smoker and Patient abstained from smoking.,    breath sounds clear to auscultation- rhonchi (-) wheezing      Cardiovascular Exercise Tolerance: Good (-) hypertension(-) CAD, (-) Past MI, (-) Cardiac Stents and (-) CABG  Rhythm:Regular Rate:Normal - Systolic murmurs and - Diastolic murmurs    Neuro/Psych neg Seizures negative neurological ROS  negative psych ROS   GI/Hepatic negative GI ROS, Neg liver ROS,   Endo/Other  negative endocrine ROSneg diabetes  Renal/GU negative Renal ROS     Musculoskeletal negative musculoskeletal ROS (+)   Abdominal (+) - obese,   Peds  Hematology negative hematology ROS (+)   Anesthesia Other Findings    Reproductive/Obstetrics                             Anesthesia Physical Anesthesia Plan  ASA: II  Anesthesia Plan: General   Post-op Pain Management:    Induction: Intravenous  PONV Risk Score and Plan: 0 and Ondansetron  Airway Management Planned: Oral ETT  Additional Equipment:   Intra-op Plan:   Post-operative Plan: Extubation in OR  Informed Consent: I have reviewed the patients History and Physical, chart, labs and discussed the procedure including the risks, benefits and alternatives for the proposed anesthesia with the patient or authorized representative who has indicated his/her understanding and acceptance.     Dental advisory given  Plan Discussed with: CRNA and Anesthesiologist  Anesthesia Plan Comments:          Anesthesia Quick Evaluation

## 2020-01-30 NOTE — Anesthesia Postprocedure Evaluation (Signed)
Anesthesia Post Note  Patient: Marcus Valenzuela  Procedure(s) Performed: HEMORRHOIDECTOMY (Left )  Patient location during evaluation: PACU Anesthesia Type: General Level of consciousness: awake and alert and oriented Pain management: pain level controlled Vital Signs Assessment: post-procedure vital signs reviewed and stable Respiratory status: spontaneous breathing, nonlabored ventilation and respiratory function stable Cardiovascular status: blood pressure returned to baseline and stable Postop Assessment: no signs of nausea or vomiting Anesthetic complications: no   No complications documented.   Last Vitals:  Vitals:   01/30/20 0928 01/30/20 1017  BP: (!) 114/98 123/83  Pulse: 78 76  Resp: (!) 22 18  Temp: 36.6 C   SpO2: 92% 96%    Last Pain:  Vitals:   01/30/20 1017  TempSrc:   PainSc: 3                  Georgianna Band

## 2020-01-30 NOTE — Transfer of Care (Signed)
Immediate Anesthesia Transfer of Care Note  Patient: Marcus Valenzuela  Procedure(s) Performed: HEMORRHOIDECTOMY (Left )  Patient Location: PACU  Anesthesia Type:General  Level of Consciousness: awake  Airway & Oxygen Therapy: Patient connected to face mask oxygen  Post-op Assessment: Post -op Vital signs reviewed and stable  Post vital signs: stable  Last Vitals:  Vitals Value Taken Time  BP 118/83 01/30/20 0857  Temp    Pulse 72 01/30/20 0859  Resp 22 01/30/20 0859  SpO2 94 % 01/30/20 0859  Vitals shown include unvalidated device data.  Last Pain:  Vitals:   01/30/20 0639  TempSrc: Tympanic  PainSc: 5          Complications: No complications documented.

## 2020-01-30 NOTE — Anesthesia Procedure Notes (Signed)
Procedure Name: Intubation Date/Time: 01/30/2020 7:38 AM Performed by: Irving Burton, CRNA Pre-anesthesia Checklist: Patient identified, Patient being monitored, Timeout performed, Emergency Drugs available and Suction available Patient Re-evaluated:Patient Re-evaluated prior to induction Oxygen Delivery Method: Circle system utilized Preoxygenation: Pre-oxygenation with 100% oxygen Induction Type: IV induction Ventilation: Mask ventilation without difficulty Laryngoscope Size: McGraph and 4 Grade View: Grade I Tube type: Oral Tube size: 7.5 mm Number of attempts: 1 Airway Equipment and Method: Stylet and Video-laryngoscopy Placement Confirmation: ETT inserted through vocal cords under direct vision,  positive ETCO2 and breath sounds checked- equal and bilateral Secured at: 22 cm Tube secured with: Tape Dental Injury: Teeth and Oropharynx as per pre-operative assessment

## 2020-01-30 NOTE — Discharge Instructions (Signed)
AMBULATORY SURGERY  °DISCHARGE INSTRUCTIONS ° ° °1) The drugs that you were given will stay in your system until tomorrow so for the next 24 hours you should not: ° °A) Drive an automobile °B) Make any legal decisions °C) Drink any alcoholic beverage ° ° °2) You may resume regular meals tomorrow.  Today it is better to start with liquids and gradually work up to solid foods. ° °You may eat anything you prefer, but it is better to start with liquids, then soup and crackers, and gradually work up to solid foods. ° ° °3) Please notify your doctor immediately if you have any unusual bleeding, trouble breathing, redness and pain at the surgery site, drainage, fever, or pain not relieved by medication. ° ° ° °4) Additional Instructions: ° ° ° ° ° ° ° °Please contact your physician with any problems or Same Day Surgery at [phone], Monday through Friday 6 am to 4 pm, or [place] [facility] [place] [facility] number at [phone]. °

## 2020-01-30 NOTE — Interval H&P Note (Signed)
History and Physical Interval Note:  01/30/2020 6:58 AM  Marcus Valenzuela  has presented today for surgery, with the diagnosis of Hemorrhoids.  The various methods of treatment have been discussed with the patient and family. After consideration of risks, benefits and other options for treatment, the patient has consented to  Procedure(s): HEMORRHOIDECTOMY (Left) as a surgical intervention.  The patient's history has been reviewed, patient examined, no change in status, stable for surgery.  I have reviewed the patient's chart and labs.  Questions were answered to the patient's satisfaction.     Campbell Lerner Campbell Lerner, M.D., [facility]  01/30/2020 ; 6:58 AM

## 2020-01-30 NOTE — Op Note (Signed)
SURGICAL OPERATIVE REPORT   DATE OF PROCEDURE: 01/30/2020  ATTENDING Surgeon(s): Campbell Lerner, MD  ANESTHESIA: GETA  PRE-OPERATIVE DIAGNOSIS: Symptomatic (painful)/Bleeding 2nd and 3rd degree internal and external hemorrhoids with symptoms not well-controlled despite medical management   POST-OPERATIVE DIAGNOSIS: Same  PROCEDURE(S):  1.) Anoscopy 2.) Extensive, 2  Column, Hemorrhectomy (Left posterior/anterior, hemi internal/external, & Right anterior internal/external (cpt: 46260)  INTRAOPERATIVE FINDINGS: 2nd degree on pt's right, and  3rd degree internal hemorrhoids with external hemorrhoids involving the entire pt's left side.   ESTIMATED BLOOD LOSS: 15 mL  URINE OUTPUT: No foley   SPECIMENS: Left posterior/anterior internal/external, Right anterior internal/external hemorrhoids   COMPLICATIONS: None apparent   CONDITION AT END OF PROCEDURE: Hemodynamically stable and extubated   DISPOSITION OF PATIENT: PACU  INDICATIONS FOR PROCEDURE:  Patient is a 55 y.o. male who presented with symptomatic (painful)/bleeding 2nd/3rd degree internal and external hemorrhoids not well-controlled with medical management. Patient has yet to undergo colonoscopy.  All risks, benefits, and alternatives to above procedure were discussed with the patient, all of patient's questions were answered to his expressed satisfaction, and informed consent was obtained and documented.   DETAILS OF PROCEDURE: Patient was brought to the operative suite and appropriately identified. General  anesthesia was administered along with appropriate pre-operative antibiotics, and endotracheal intubation was performed by anesthetist.  Repositioned to prone/jackknife and buttocks taped for exposure, operative site was prepped and draped in the usual sterile fashion, and following a brief time out, rigid anoscopy was performed with findings of no distal rectal masses, normal mucosa, remarkably prominent areas involving  the left anterior and posterior, and right anterior quadrants. Local anesthetic quarter percent Marcaine with epinephrine was injected, and 2 hemorrhoidal pedicles were identified.  The left side involved a third of the circumference of the entire anus.  An Alice clamp was placed near the base of each pedicle near the dentate line and retracted externally to exteriorize the hemorrhoidal pedicle. Retracted hemorrhoid was carefully dissected from underlying/adherent tissues, and electrosurgery/electrocautery was advanced across the base of the hemorrhoidal pedicle, and the above was repeated for both areas of internal/external hemorrhoids. The underlying internal sphincteric muscle fibers were preserved.  Hemostasis was achieved/confirmed, a hemostatic locking 3-0 chromic suture is applied in running manner from proximally to distally, leaving the distal aspect open, and a Surgifoam is saturated with Nupercainal ointment was inserted into the anal canal for additional hemostasis and pain control. Fluffs, ABD, and mesh briefs were applied.  Patient was then safely able to be extubated, awakened, and transferred to PACU for post-operative monitoring and care.  Campbell Lerner, M.D., [facility]  01/30/2020 ; 8:58 AM

## 2020-01-31 ENCOUNTER — Encounter: Payer: Self-pay | Admitting: Surgery

## 2020-01-31 LAB — SURGICAL PATHOLOGY

## 2020-02-03 ENCOUNTER — Telehealth: Payer: Self-pay | Admitting: Surgery

## 2020-02-03 NOTE — Telephone Encounter (Signed)
Patient is calling and is asking if he can get a refill on the numbing medicine. Please call patient and advise.

## 2020-02-03 NOTE — Telephone Encounter (Signed)
Due to Dr Claudine Mouton being in the OR and with clinic closing early, I did not want patient waiting until Monday to receive message.   I spoke to one of the providers here at the office and they told me it was okay to call in prescription.  Prescription called in at this time to [facility].  Called pt and advised him we did call prescription in. Pharmacy will call him when ready for pick up. Self pay $45.00  Pt voiced understanding and has no further concerns.

## 2020-02-03 NOTE — Telephone Encounter (Signed)
Called pt in regards to medication refill. Pt states before sx he was prescribed nifedipine/lidocaine paste (sent to [facility]). States he is out of the medication and is asking if we can send in a new one. Pt is aware this compound medication is self pay $45.00 and can only be picked up at [facility] drug. Advised pt I will send Dr Claudine Mouton a message to see if it is okay to call the medication in. Advised pt Dr Claudine Mouton is in sx so it will take a while.   Messaged routed to Dr Claudine Mouton at this time.

## 2020-02-21 ENCOUNTER — Encounter: Payer: Self-pay | Admitting: Surgery

## 2020-02-21 ENCOUNTER — Other Ambulatory Visit: Payer: Self-pay

## 2020-02-21 ENCOUNTER — Ambulatory Visit (INDEPENDENT_AMBULATORY_CARE_PROVIDER_SITE_OTHER): Payer: Self-pay | Admitting: Surgery

## 2020-02-21 VITALS — BP 123/87 | HR 81 | Temp 98.6°F | Ht 66.0 in | Wt 173.4 lb

## 2020-02-21 DIAGNOSIS — Z8719 Personal history of other diseases of the digestive system: Secondary | ICD-10-CM | POA: Insufficient documentation

## 2020-02-21 DIAGNOSIS — Z9889 Other specified postprocedural states: Secondary | ICD-10-CM

## 2020-02-21 NOTE — Patient Instructions (Addendum)
Dr.Rodenberg discussed with patient to increase water intake.  Dr.Rodenberg discussed with patient he should have at least 1 bowel movement after every meal.   GENERAL POST-OPERATIVE PATIENT INSTRUCTIONS   WOUND CARE INSTRUCTIONS:  Keep a dry clean dressing on the wound if there is drainage. The initial bandage may be removed after 24 hours.  Once the wound has quit draining you may leave it open to air.  If clothing rubs against the wound or causes irritation and the wound is not draining you may cover it with a dry dressing during the daytime.  Try to keep the wound dry and avoid ointments on the wound unless directed to do so.  If the wound becomes bright red and painful or starts to drain infected material that is not clear, please contact your physician immediately.  If the wound is mildly pink and has a thick firm ridge underneath it, this is normal, and is referred to as a healing ridge.  This will resolve over the next 4-6 weeks.  BATHING: You may shower if you have been informed of this by your surgeon. However, Please do not submerge in a tub, hot tub, or pool until incisions are completely sealed or have been told by your surgeon that you may do so.  DIET:  You may eat any foods that you can tolerate.  It is a good idea to eat a high fiber diet and take in plenty of fluids to prevent constipation.  If you do become constipated you may want to take a mild laxative or take ducolax tablets on a daily basis until your bowel habits are regular.  Constipation can be very uncomfortable, along with straining, after recent surgery.  ACTIVITY:  You are encouraged to cough and deep breath or use your incentive spirometer if you were given one, every 15-30 minutes when awake.  This will help prevent respiratory complications and low grade fevers post-operatively if you had a general anesthetic.  You may want to hug a pillow when coughing and sneezing to add additional support to the surgical area, if you  had abdominal or chest surgery, which will decrease pain during these times.  You are encouraged to walk and engage in light activity for the next two weeks.  You should not lift more than 20 pounds, until 4-6 weeks after surgery as it could put you at increased risk for complications.  Twenty pounds is roughly equivalent to a plastic bag of groceries. At that time- Listen to your body when lifting, if you have pain when lifting, stop and then try again in a few days. Soreness after doing exercises or activities of daily living is normal as you get back in to your normal routine.  MEDICATIONS:  Try to take narcotic medications and anti-inflammatory medications, such as tylenol, ibuprofen, naprosyn, etc., with food.  This will minimize stomach upset from the medication.  Should you develop nausea and vomiting from the pain medication, or develop a rash, please discontinue the medication and contact your physician.  You should not drive, make important decisions, or operate machinery when taking narcotic pain medication.  SUNBLOCK Use sun block to incision area over the next year if this area will be exposed to sun. This helps decrease scarring and will allow you avoid a permanent darkened area over your incision.  QUESTIONS:  Please feel free to call our office if you have any questions, and we will be glad to assist you.

## 2020-02-21 NOTE — Progress Notes (Signed)
[facility] POST-OP OFFICE VISIT  02/21/2020  HPI: Marcus Valenzuela is a 54 y.o. male who on June 21 underwent hemorrhoidectomy.  He reports developing a gluteal abscess, which with warm compresses spontaneously drained.  He reports the immediate few days after surgery were pain-free, but became progressively painful as the Exparel wore off.  He reports that he has had hard stools, and has skipped days of moving his bowels.  He also reports he takes large amounts of fiber, well admitting some relative dehydration and concentrated urine.  He reports he still gets constipated.  He reports he has a lot of pain with bowel movements.  He denies nausea, vomiting, fevers and chills.  He has been doing sitz bath's twice daily.  Vital signs: BP 123/87   Pulse 81   Temp 98.6 F (37 C) (Oral)   Ht 5\' 6"  (1.676 m)   Wt 173 lb 6.4 oz (78.7 kg)   SpO2 98%   BMI 27.99 kg/m    Physical Exam: Constitutional: Appears well. Abdomen: Benign. Skin: Perianal skin appears great, there is a lingering tag on the left side where the majority of his hemorrhoidectomy was completed.  On examination he is tender in that area as well, and the scarring is rather readily palpable.  There is no bright red blood on examination.  I do not believe there is any lingering fissure present, he is somewhat intolerant of the exam.  There is an external pinpoint site of drainage of what appears to be a dermal abscess, it does not appear consistent with a perianal or perirectal just proximity of location.  Assessment/Plan: This is a 54 y.o. male 22 days s/p hemorrhoidectomy. Again I reiterated the importance of fluid intake and helping his fiber be affected with his bowel activity.  We discussed the need to move his bowels daily and not to skip bowel movements.  To utilize additional laxatives as needed to keep his bowels moving but most importantly to improve his fluid intake.  I believe he understands this and I  expect his pain level to improve as he keeps moving his bowels regularly. He felt he was unable to resume the heavy lifting at his work at this time so will return him to work with light duty for 2 weeks.  And then resume full activity.  There are no problems to display for this patient.   -I asked if I could see him again in a month and he has made an appointment for follow-up.   57 M.D., FACS 02/21/2020, 10:08 AM

## 2020-03-20 ENCOUNTER — Encounter: Payer: Self-pay | Admitting: Surgery

## 2021-07-23 ENCOUNTER — Telehealth: Payer: Self-pay

## 2021-07-23 NOTE — Telephone Encounter (Signed)
Placing New Patient paperwork upfront for patient/patients wife to pick up today.

## 2021-07-30 ENCOUNTER — Other Ambulatory Visit: Payer: Self-pay

## 2021-07-30 ENCOUNTER — Ambulatory Visit: Payer: Self-pay | Admitting: Adult Health

## 2021-07-30 ENCOUNTER — Encounter: Payer: Self-pay | Admitting: Adult Health

## 2021-07-30 ENCOUNTER — Ambulatory Visit (INDEPENDENT_AMBULATORY_CARE_PROVIDER_SITE_OTHER): Payer: Self-pay | Admitting: Adult Health

## 2021-07-30 VITALS — BP 132/88 | HR 72 | Temp 97.8°F | Ht 65.98 in | Wt 183.4 lb

## 2021-07-30 DIAGNOSIS — K219 Gastro-esophageal reflux disease without esophagitis: Secondary | ICD-10-CM

## 2021-07-30 DIAGNOSIS — Z789 Other specified health status: Secondary | ICD-10-CM

## 2021-07-30 DIAGNOSIS — B369 Superficial mycosis, unspecified: Secondary | ICD-10-CM

## 2021-07-30 DIAGNOSIS — H60391 Other infective otitis externa, right ear: Secondary | ICD-10-CM

## 2021-07-30 DIAGNOSIS — M62830 Muscle spasm of back: Secondary | ICD-10-CM

## 2021-07-30 DIAGNOSIS — Z9889 Other specified postprocedural states: Secondary | ICD-10-CM

## 2021-07-30 DIAGNOSIS — Z125 Encounter for screening for malignant neoplasm of prostate: Secondary | ICD-10-CM

## 2021-07-30 DIAGNOSIS — H6501 Acute serous otitis media, right ear: Secondary | ICD-10-CM

## 2021-07-30 DIAGNOSIS — R3915 Urgency of urination: Secondary | ICD-10-CM

## 2021-07-30 DIAGNOSIS — R109 Unspecified abdominal pain: Secondary | ICD-10-CM

## 2021-07-30 DIAGNOSIS — Z13220 Encounter for screening for lipoid disorders: Secondary | ICD-10-CM

## 2021-07-30 LAB — COMPREHENSIVE METABOLIC PANEL
ALT: 59 U/L — ABNORMAL HIGH (ref 0–53)
AST: 45 U/L — ABNORMAL HIGH (ref 0–37)
Albumin: 4.3 g/dL (ref 3.5–5.2)
Alkaline Phosphatase: 65 U/L (ref 39–117)
BUN: 12 mg/dL (ref 6–23)
CO2: 29 mEq/L (ref 19–32)
Calcium: 9.9 mg/dL (ref 8.4–10.5)
Chloride: 102 mEq/L (ref 96–112)
Creatinine, Ser: 0.79 mg/dL (ref 0.40–1.50)
GFR: 98.39 mL/min (ref >60.00)
Glucose, Bld: 99 mg/dL (ref 70–99)
Potassium: 4.1 mEq/L (ref 3.5–5.1)
Sodium: 137 mEq/L (ref 135–145)
Total Bilirubin: 0.6 mg/dL (ref 0.2–1.2)
Total Protein: 8.1 g/dL (ref 6.0–8.3)

## 2021-07-30 LAB — LIPID PANEL
Cholesterol: 164 mg/dL (ref 0–200)
HDL: 33 mg/dL — ABNORMAL LOW (ref >39.00)
LDL Cholesterol: 107 mg/dL — ABNORMAL HIGH (ref 0–99)
NonHDL: 131.08
Total CHOL/HDL Ratio: 5
Triglycerides: 118 mg/dL (ref 0.0–149.0)
VLDL: 23.6 mg/dL (ref 0.0–40.0)

## 2021-07-30 LAB — AMYLASE: Amylase: 48 U/L (ref 27–131)

## 2021-07-30 LAB — CBC WITH DIFFERENTIAL/PLATELET
Basophils Absolute: 0.1 10*3/uL (ref 0.0–0.1)
Basophils Relative: 1.2 % (ref 0.0–3.0)
Eosinophils Absolute: 0.2 10*3/uL (ref 0.0–0.7)
Eosinophils Relative: 1.6 % (ref 0.0–5.0)
HCT: 45.8 % (ref 39.0–52.0)
Hemoglobin: 15.4 g/dL (ref 13.0–17.0)
Lymphocytes Relative: 34.6 % (ref 12.0–46.0)
Lymphs Abs: 3.5 10*3/uL (ref 0.7–4.0)
MCHC: 33.6 g/dL (ref 30.0–36.0)
MCV: 96.7 fl (ref 78.0–100.0)
Monocytes Absolute: 0.7 10*3/uL (ref 0.1–1.0)
Monocytes Relative: 7.4 % (ref 3.0–12.0)
Neutro Abs: 5.6 10*3/uL (ref 1.4–7.7)
Neutrophils Relative %: 55.2 % (ref 43.0–77.0)
Platelets: 197 10*3/uL (ref 150.0–400.0)
RBC: 4.73 Mil/uL (ref 4.22–5.81)
RDW: 13.5 % (ref 11.5–15.5)
WBC: 10.1 10*3/uL (ref 4.0–10.5)

## 2021-07-30 LAB — TSH: TSH: 1.68 u[IU]/mL (ref 0.35–5.50)

## 2021-07-30 LAB — URINALYSIS, ROUTINE W REFLEX MICROSCOPIC
Bilirubin Urine: NEGATIVE
Hgb urine dipstick: NEGATIVE
Ketones, ur: NEGATIVE
Leukocytes,Ua: NEGATIVE
Nitrite: NEGATIVE
RBC / HPF: NONE SEEN (ref 0–?)
Specific Gravity, Urine: 1.01 (ref 1.000–1.030)
Total Protein, Urine: NEGATIVE
Urine Glucose: NEGATIVE
Urobilinogen, UA: 1 (ref 0.0–1.0)
pH: 7 (ref 5.0–8.0)

## 2021-07-30 LAB — LIPASE: Lipase: 20 U/L (ref 11.0–59.0)

## 2021-07-30 LAB — PSA: PSA: 0.93 ng/mL (ref 0.10–4.00)

## 2021-07-30 LAB — BILIRUBIN, TOTAL: Total Bilirubin: 0.6 mg/dL (ref 0.2–1.2)

## 2021-07-30 MED ORDER — NEOMYCIN-POLYMYXIN-HC 3.5-10000-1 OT SOLN: 3.0000 [drp] | Freq: Four times a day (QID) | OTIC | 0 refills | Status: DC

## 2021-07-30 MED ORDER — KETOCONAZOLE 2 % EX CREA: 1.0000 "application " | TOPICAL_CREAM | Freq: Two times a day (BID) | CUTANEOUS | 0 refills | Status: DC

## 2021-07-30 MED ORDER — CYCLOBENZAPRINE HCL 10 MG PO TABS: 10.0000 mg | ORAL_TABLET | Freq: Two times a day (BID) | ORAL | 0 refills | Status: DC | PRN

## 2021-07-30 MED ORDER — CYCLOBENZAPRINE HCL 10 MG PO TABS: 10.0000 mg | ORAL_TABLET | Freq: Every evening | ORAL | 0 refills | Status: DC | PRN

## 2021-07-30 MED ORDER — AMOXICILLIN-POT CLAVULANATE 875-125 MG PO TABS: 1.0000 | ORAL_TABLET | Freq: Two times a day (BID) | ORAL | 0 refills | Status: DC

## 2021-07-30 NOTE — Patient Instructions (Addendum)
Start Claritin 10mg  daily at night if makes drowsy.  Flonase daily.   Dr. Allen Norris in GI should call you within 2 weeks if you do not hear please call.   Increase Protonix to 40mg  once daily.     Neomycin; Polymyxin B; Hydrocortisone Ear Solution What is this medication? NEOMYCIN; POLYMYXIN B; HYDROCORTISONE (nee oh MYE sin; pol i MIX in B; hye droe KOR ti sone) is used to treat ear infections. This medicine may be used for other purposes; ask your health care provider or pharmacist if you have questions. COMMON BRAND NAME(S): AK-Spore HC, AK-Spore HC Otic, Antibiotic Otic, Cortisporin, Cortomycin, Oti-Sone, Oticin HC, Otimar, Otocidin What should I tell my care team before I take this medication? They need to know if you have any of these conditions: any other active infections chronic ear infections or fluid in the ear perforated ear drum an unusual or allergic reaction to neomycin, polymyxin B, hydrocortisone, sulfites, other medicines, foods, dyes, or preservatives pregnant or trying to get pregnant breast-feeding How should I use this medication? This medicine is only for use in the ears. Follow the directions on the prescription label. Wash hands before and after use. Clean your ear of any fluid that can be easily removed. Do not insert any object or swab into the ear canal. Gently warm the bottle by holding it in the hand for 1 to 2 minutes. Lie down on your side with the infected ear facing upward. Try not to touch the tip of the dropper to your ear, fingertips, or other surface. Squeeze the bottle gently to put the prescribed number of drops in the ear canal. Stay in this position for 30 to 60 seconds to help the drops soak into the ear. Repeat the steps for the other ear if both ears are infected. Do not use your medicine more often than directed. Finish the full course of medicine prescribed by your doctor or health care professional even if you think your condition is better. Talk to  your pediatrician regarding the use of this medicine in children. While this drug may be prescribed for selected conditions, precautions do apply. Overdosage: If you think you have taken too much of this medicine contact a poison control center or emergency room at once. NOTE: This medicine is only for you. Do not share this medicine with others. What if I miss a dose? If you miss a dose, use it as soon as you can. If it is almost time for your next dose, use only that dose. Do not take double or extra doses. What may interact with this medication? Interactions are not expected. Do not use other ear products without talking to your doctor or health care professional. This list may not describe all possible interactions. Give your health care provider a list of all the medicines, herbs, non-prescription drugs, or dietary supplements you use. Also tell them if you smoke, drink alcohol, or use illegal drugs. Some items may interact with your medicine. What should I watch for while using this medication? Tell your doctor or health care professional if your ear infection does not get better in a few days. Do not use longer than 10 days unless instructed by your doctor or health care professional. If rash or allergic reaction occurs, stop the product immediately and contact your physician. It is important that you keep the infected ear(s) clean and dry. When bathing, try not to get the infected ear(s) wet. Do not go swimming unless your doctor or  health care professional has told you otherwise. To prevent the spread of infection, do not share ear products, or share towels and washcloths with anyone else. What side effects may I notice from receiving this medication? Side effects that you should report to your doctor or health care professional as soon as possible: rash red, itchy, dry scaly skin at the affected site worsening ear pain Side effects that usually do not require medical attention (report to  your doctor or health care professional if they continue or are bothersome): abnormal sensation in the ear burning or stinging while putting the drops in the ear This list may not describe all possible side effects. Call your doctor for medical advice about side effects. You may report side effects to FDA at 1-800-FDA-1088. Where should I keep my medication? Keep out of the reach of children. Store at room temperature between 15 and 25 degrees C (59 and 77 degrees F). Do not freeze. Throw away any unused medicine after the expiration date. NOTE: This sheet is a summary. It may not cover all possible information. If you have questions about this medicine, talk to your doctor, pharmacist, or health care provider.  2022 Elsevier/Gold Standard (2021-04-16 00:00:00) Amoxicillin; Clavulanic Acid Tablets What is this medication? AMOXICILLIN; CLAVULANIC ACID (a mox i SIL in; KLAV yoo lan ic AS id) treats infections caused by bacteria. It belongs to a group of medications called penicillin antibiotics. It will not treat colds, the flu, or infections caused by viruses. This medicine may be used for other purposes; ask your health care provider or pharmacist if you have questions. COMMON BRAND NAME(S): Augmentin What should I tell my care team before I take this medication? They need to know if you have any of these conditions: Kidney disease Liver disease Mononucleosis Stomach or intestine problems such as colitis An unusual or allergic reaction to amoxicillin, other penicillin or cephalosporin antibiotics, clavulanic acid, other medications, foods, dyes, or preservatives Pregnant or trying to get pregnant Breast-feeding How should I use this medication? Take this medication by mouth. Take it as directed on the prescription label at the same time every day. Take it with food at the start of a meal or snack. Take all of this medication unless your care team tells you to stop it early. Keep taking it  even if you think you are better. Talk to your care team about the use of this medication in children. While it may be prescribed for selected conditions, precautions do apply. Overdosage: If you think you have taken too much of this medicine contact a poison control center or emergency room at once. NOTE: This medicine is only for you. Do not share this medicine with others. What if I miss a dose? If you miss a dose, take it as soon as you can. If it is almost time for your next dose, take only that dose. Do not take double or extra doses. What may interact with this medication? Allopurinol Anticoagulants Birth control pills Methotrexate Probenecid This list may not describe all possible interactions. Give your health care provider a list of all the medicines, herbs, non-prescription drugs, or dietary supplements you use. Also tell them if you smoke, drink alcohol, or use illegal drugs. Some items may interact with your medicine. What should I watch for while using this medication? Tell your care team if your symptoms do not start to get better or if they get worse. This medication may cause serious skin reactions. They can happen weeks to  months after starting the medication. Contact your care team right away if you notice fevers or flu-like symptoms with a rash. The rash may be red or purple and then turn into blisters or peeling of the skin. Or, you might notice a red rash with swelling of the face, lips or lymph nodes in your neck or under your arms. Do not treat diarrhea with over the counter products. Contact your care team if you have diarrhea that lasts more than 2 days or if it is severe and watery. If you have diabetes, you may get a false-positive result for sugar in your urine. Check with your care team. Birth control may not work properly while you are taking this medication. Talk to your care team about using an extra method of birth control. What side effects may I notice from  receiving this medication? Side effects that you should report to your care team as soon as possible: Allergic reactions--skin rash, itching, hives, swelling of the face, lips, tongue, or throat Liver injury--right upper belly pain, loss of appetite, nausea, light-colored stool, dark yellow or brown urine, yellowing skin or eyes, unusual weakness or fatigue Redness, blistering, peeling, or loosening of the skin, including inside the mouth Severe diarrhea, fever Unusual vaginal discharge, itching, or odor Side effects that usually do not require medical attention (report to your care team if they continue or are bothersome): Diarrhea Nausea Vomiting This list may not describe all possible side effects. Call your doctor for medical advice about side effects. You may report side effects to FDA at 1-800-FDA-1088. Where should I keep my medication? Keep out of the reach of children and pets. Store at room temperature between 20 and 25 degrees C (68 and 77 degrees F). Throw away any unused medication after the expiration date. NOTE: This sheet is a summary. It may not cover all possible information. If you have questions about this medicine, talk to your doctor, pharmacist, or health care provider.  2022 Elsevier/Gold Standard (2020-07-22 00:00:00) Otitis Media, Adult Otitis media occurs when there is inflammation and fluid in the middle ear with signs and symptoms of an acute infection. The middle ear is a part of the ear that contains bones for hearing as well as air that helps send sounds to the brain. When infected fluid builds up in this space, it causes pressure and can lead to an ear infection. The eustachian tube connects the middle ear to the back of the nose (nasopharynx) and normally allows air into the middle ear. If the eustachian tube becomes blocked, fluid can build up and become infected. What are the causes? This condition is caused by a blockage in the eustachian tube. This can be  caused by mucus or by swelling of the tube. Problems that can cause a blockage include: A cold or other upper respiratory infection. Allergies. An irritant, such as tobacco smoke. Enlarged adenoids. The adenoids are areas of soft tissue located high in the back of the throat, behind the nose and the roof of the mouth. They are part of the body's defense system (immune system). A mass in the nasopharynx. Damage to the ear caused by pressure changes (barotrauma). What increases the risk? You are more likely to develop this condition if you: Smoke or are exposed to tobacco smoke. Have an opening in the roof of your mouth (cleft palate). Have gastroesophageal reflux. Have an immune system disorder. What are the signs or symptoms? Symptoms of this condition include: Ear pain. Fever. Decreased hearing.  Tiredness (lethargy). Fluid leaking from the ear, if the eardrum is ruptured or has burst. Ringing in the ear. How is this diagnosed? This condition is diagnosed with a physical exam. During the exam, your health care provider will use an instrument called an otoscope to look in your ear and check for redness, swelling, and fluid. He or she will also ask about your symptoms. Your health care provider may also order tests, such as: A pneumatic otoscopy. This is a test to check the movement of the eardrum. It is done by squeezing a small amount of air into the ear. A tympanogram. This is a test that shows how well the eardrum moves in response to air pressure in the ear canal. It provides a graph for your health care provider to review. How is this treated? This condition can go away on its own within 3-5 days. But if the condition is caused by a bacterial infection and does not go away on its own, or if it keeps coming back, your health care provider may: Prescribe antibiotic medicine to treat the infection. Prescribe or recommend medicines to control pain. Follow these instructions at  home: Take over-the-counter and prescription medicines only as told by your health care provider. If you were prescribed an antibiotic medicine, take it as told by your health care provider. Do not stop taking the antibiotic even if you start to feel better. Keep all follow-up visits. This is important. Contact a health care provider if: You have bleeding from your nose. There is a lump on your neck. You are not feeling better in 5 days. You feel worse instead of better. Get help right away if: You have severe pain that is not controlled with medicine. You have swelling, redness, or pain around your ear. You have stiffness in your neck. A part of your face is not moving (paralyzed). The bone behind your ear (mastoid bone) is tender when you touch it. You develop a severe headache. Summary Otitis media is redness, soreness, and swelling of the middle ear, usually resulting in pain and decreased hearing. This condition can go away on its own within 3-5 days. If the problem does not go away in 3-5 days, your health care provider may give you medicines to treat the infection. If you were prescribed an antibiotic medicine, take it as told by your health care provider. Follow all instructions that were given to you by your health care provider. This information is not intended to replace advice given to you by your health care provider. Make sure you discuss any questions you have with your health care provider. Document Revised: 11/05/2020 Document Reviewed: 11/05/2020 Elsevier Patient Education  [place] Maintenance, Male Adopting a healthy lifestyle and getting preventive care are important in promoting health and wellness. Ask your health care provider about: The right schedule for you to have regular tests and exams. Things you can do on your own to prevent diseases and keep yourself healthy. What should I know about diet, weight, and exercise? Eat a healthy diet  Eat  a diet that includes plenty of vegetables, fruits, low-fat dairy products, and lean protein. Do not eat a lot of foods that are high in solid fats, added sugars, or sodium. Maintain a healthy weight Body mass index (BMI) is a measurement that can be used to identify possible weight problems. It estimates body fat based on height and weight. Your health care provider can help determine your BMI and help you achieve  or maintain a healthy weight. Get regular exercise Get regular exercise. This is one of the most important things you can do for your health. Most adults should: Exercise for at least 150 minutes each week. The exercise should increase your heart rate and make you sweat (moderate-intensity exercise). Do strengthening exercises at least twice a week. This is in addition to the moderate-intensity exercise. Spend less time sitting. Even light physical activity can be beneficial. Watch cholesterol and blood lipids Have your blood tested for lipids and cholesterol at 57 years of age, then have this test every 5 years. You may need to have your cholesterol levels checked more often if: Your lipid or cholesterol levels are high. You are older than 57 years of age. You are at high risk for heart disease. What should I know about cancer screening? Many types of cancers can be detected early and may often be prevented. Depending on your health history and family history, you may need to have cancer screening at various ages. This may include screening for: Colorectal cancer. Prostate cancer. Skin cancer. Lung cancer. What should I know about heart disease, diabetes, and high blood pressure? Blood pressure and heart disease High blood pressure causes heart disease and increases the risk of stroke. This is more likely to develop in people who have high blood pressure readings or are overweight. Talk with your health care provider about your target blood pressure readings. Have your blood  pressure checked: Every 3-5 years if you are 63-39 years of age. Every year if you are 52 years old or older. If you are between the ages of 34 and 20 and are a current or former smoker, ask your health care provider if you should have a one-time screening for abdominal aortic aneurysm (AAA). Diabetes Have regular diabetes screenings. This checks your fasting blood sugar level. Have the screening done: Once every three years after age 67 if you are at a normal weight and have a low risk for diabetes. More often and at a younger age if you are overweight or have a high risk for diabetes. What should I know about preventing infection? Hepatitis B If you have a higher risk for hepatitis B, you should be screened for this virus. Talk with your health care provider to find out if you are at risk for hepatitis B infection. Hepatitis C Blood testing is recommended for: Everyone born from [birth year] through 1967. Anyone with known risk factors for hepatitis C. Sexually transmitted infections (STIs) You should be screened each year for STIs, including gonorrhea and chlamydia, if: You are sexually active and are younger than 57 years of age. You are older than 57 years of age and your health care provider tells you that you are at risk for this type of infection. Your sexual activity has changed since you were last screened, and you are at increased risk for chlamydia or gonorrhea. Ask your health care provider if you are at risk. Ask your health care provider about whether you are at high risk for HIV. Your health care provider may recommend a prescription medicine to help prevent HIV infection. If you choose to take medicine to prevent HIV, you should first get tested for HIV. You should then be tested every 3 months for as long as you are taking the medicine. Follow these instructions at home: Alcohol use Do not drink alcohol if your health care provider tells you not to drink. If you drink  alcohol: Limit how much you have  to 0-2 drinks a day. Know how much alcohol is in your drink. In the [place] one drink equals one 12 oz bottle of beer (355 mL), one 5 oz glass of wine (148 mL), or one 1 oz glass of hard liquor (44 mL). Lifestyle Do not use any products that contain nicotine or tobacco. These products include cigarettes, chewing tobacco, and vaping devices, such as e-cigarettes. If you need help quitting, ask your health care provider. Do not use street drugs. Do not share needles. Ask your health care provider for help if you need support or information about quitting drugs. General instructions Schedule regular health, dental, and eye exams. Stay current with your vaccines. Tell your health care provider if: You often feel depressed. You have ever been abused or do not feel safe at home. Summary Adopting a healthy lifestyle and getting preventive care are important in promoting health and wellness. Follow your health care provider's instructions about healthy diet, exercising, and getting tested or screened for diseases. Follow your health care provider's instructions on monitoring your cholesterol and blood pressure. This information is not intended to replace advice given to you by your health care provider. Make sure you discuss any questions you have with your health care provider. Document Revised: 12/17/2020 Document Reviewed: 12/17/2020 Elsevier Patient Education  [place].

## 2021-07-30 NOTE — Progress Notes (Signed)
Small amount of mucus in urine, otherwise negative urine. Will wait on urine culture .  PSA for prostate is within normal limits.  CBC within normal limits no signs of infection or anemia.  CMP shows mildly elevated AST, ALT liver enzymes, will wait on acute hepatitis panel, amylase, lipase and bilirubin within normal limits. Recommend avoiding tylenol and decreasing alcohol. Has been referred to GI.   LDL  bad cholesterol elevated.  Discuss lifestyle modification with patient e.g. increase exercise, fiber, fruits, vegetables, lean meat, and omega 3/fish intake and decrease saturated fat.  If patient following strict diet and exercise program already please schedule follow up appointment with primary care provider. HDL your good cholesterol is low, you can increase this by eating healthy foods and exercise.   TSH for thyroid within normal limits.   Hep C antibody and hepatitis panel and urine culture still pending. Will result when results return.
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
## 2021-07-31 ENCOUNTER — Encounter: Payer: Self-pay | Admitting: Adult Health

## 2021-07-31 DIAGNOSIS — M62830 Muscle spasm of back: Secondary | ICD-10-CM | POA: Insufficient documentation

## 2021-07-31 DIAGNOSIS — K219 Gastro-esophageal reflux disease without esophagitis: Secondary | ICD-10-CM | POA: Insufficient documentation

## 2021-07-31 DIAGNOSIS — B369 Superficial mycosis, unspecified: Secondary | ICD-10-CM | POA: Insufficient documentation

## 2021-07-31 DIAGNOSIS — H6090 Unspecified otitis externa, unspecified ear: Secondary | ICD-10-CM | POA: Insufficient documentation

## 2021-07-31 DIAGNOSIS — Z13220 Encounter for screening for lipoid disorders: Secondary | ICD-10-CM | POA: Insufficient documentation

## 2021-07-31 DIAGNOSIS — H6501 Acute serous otitis media, right ear: Secondary | ICD-10-CM | POA: Insufficient documentation

## 2021-07-31 DIAGNOSIS — R3915 Urgency of urination: Secondary | ICD-10-CM | POA: Insufficient documentation

## 2021-07-31 DIAGNOSIS — Z789 Other specified health status: Secondary | ICD-10-CM | POA: Insufficient documentation

## 2021-07-31 DIAGNOSIS — R109 Unspecified abdominal pain: Secondary | ICD-10-CM | POA: Insufficient documentation

## 2021-07-31 DIAGNOSIS — Z9889 Other specified postprocedural states: Secondary | ICD-10-CM | POA: Insufficient documentation

## 2021-07-31 LAB — URINE CULTURE
MICRO NUMBER:: 12780100
Result:: NO GROWTH
SPECIMEN QUALITY:: ADEQUATE

## 2021-07-31 NOTE — Progress Notes (Signed)
Will the NAAT test HCV RNA or do we have to add on ?

## 2021-08-01 ENCOUNTER — Other Ambulatory Visit: Payer: Self-pay | Admitting: Adult Health

## 2021-08-01 DIAGNOSIS — R768 Other specified abnormal immunological findings in serum: Secondary | ICD-10-CM

## 2021-08-01 NOTE — Progress Notes (Signed)
Orders Placed This Encounter  Procedures   HCV RNA quant   Hepatitis B surface antibody,qualitative   Hepatitis C Genotype   HCV FibroSURE   Hepatitis A Ab, Total   Hepatitis B Surface AntiGEN   Hepatic function panel

## 2021-08-01 NOTE — Progress Notes (Signed)
Noted  

## 2021-08-01 NOTE — Addendum Note (Signed)
Addended by: Berniece Pap on: 08/01/2021 08:19 AM   Modules accepted: Orders

## 2021-08-01 NOTE — Progress Notes (Signed)
Hepatitis C antibody is positive, he has been referred to Dr. Servando Snare gastroenterology already and we need to check some additional labs to see if this is active hepatitis C or a previous infection.  I will add on these labs and please schedule patient fasting as I have ordered liver function panel. Strongly encourage cutting back or decreasing alcohol substantially.  Hepatis A and B are negative for active disease, will check antibodies.  Other hepatitis C labs are still pending.  Please see if he would like HIV test added as well?  Please schedule labs.

## 2021-08-01 NOTE — Progress Notes (Signed)
HIV test was placed for lab appointment.  Will let patient know as soon as confirmatory labs are returned for Hepatitis C- he has no history of past Hep C or treatment that he is aware of ?

## 2021-08-02 ENCOUNTER — Other Ambulatory Visit (INDEPENDENT_AMBULATORY_CARE_PROVIDER_SITE_OTHER): Payer: Self-pay

## 2021-08-02 ENCOUNTER — Other Ambulatory Visit: Payer: Self-pay

## 2021-08-02 DIAGNOSIS — R768 Other specified abnormal immunological findings in serum: Secondary | ICD-10-CM

## 2021-08-02 LAB — HEPATIC FUNCTION PANEL
ALT: 63 U/L — ABNORMAL HIGH (ref 0–53)
AST: 48 U/L — ABNORMAL HIGH (ref 0–37)
Albumin: 4.2 g/dL (ref 3.5–5.2)
Alkaline Phosphatase: 60 U/L (ref 39–117)
Bilirubin, Direct: 0.1 mg/dL (ref 0.0–0.3)
Total Bilirubin: 0.6 mg/dL (ref 0.2–1.2)
Total Protein: 7.8 g/dL (ref 6.0–8.3)

## 2021-08-03 LAB — HCV RT-PCR, QUANT (NON-GRAPH)
HCV log10: 6.855 log10 IU/mL
Hepatitis C Quantitation: 7160000 IU/mL

## 2021-08-03 LAB — HCV RNA QUANT
HCV log10: 6.887 log10 IU/mL
Hepatitis C Quantitation: 7710000 IU/mL

## 2021-08-03 LAB — ACUTE VIRAL HEPATITIS (HAV, HBV, HCV)
HCV Ab: >11 s/co ratio — ABNORMAL HIGH (ref 0.0–0.9)
Hep A IgM: NEGATIVE
Hep B C IgM: NEGATIVE
Hepatitis B Surface Ag: NEGATIVE

## 2021-08-05 NOTE — Progress Notes (Signed)
Hepatitis A and B negative for acute infection.

## 2021-08-06 LAB — HCV FIBROSURE
ALPHA 2-MACROGLOBULINS, QN: 537 mg/dL — ABNORMAL HIGH (ref 110–276)
ALT (SGPT) P5P: 70 IU/L — ABNORMAL HIGH (ref 0–55)
Apolipoprotein A-1: 115 mg/dL (ref 101–178)
Bilirubin, Total: 0.1 mg/dL (ref 0.0–1.2)
Fibrosis Score: 0.53 — ABNORMAL HIGH (ref 0.00–0.21)
GGT: 69 IU/L — ABNORMAL HIGH (ref 0–65)
Haptoglobin: 211 mg/dL (ref 29–370)
Necroinflammat Activity Score: 0.5 — ABNORMAL HIGH (ref 0.00–0.17)

## 2021-08-06 NOTE — Progress Notes (Signed)
Viral loads are present for Hepatitis C, meaning likely active infection.  I am trying to get patient in quicker with Gastroenterology for further work up and treatment sooner than later. Referral was changed to urgent and referrals Jonn Shingles is working on this.

## 2021-08-06 NOTE — Progress Notes (Signed)
HIV is negative.  No immunity to hep A or B.  Waiting on GI to schedule patient sooner. Sent as urgent referral.  Hepatitis genotype is still pending for C.

## 2021-08-07 NOTE — Progress Notes (Signed)
Noted thank you I would like to get him in with PCP as soon as possible.

## 2021-08-08 ENCOUNTER — Other Ambulatory Visit: Payer: Self-pay | Admitting: Adult Health

## 2021-08-08 DIAGNOSIS — R768 Other specified abnormal immunological findings in serum: Secondary | ICD-10-CM

## 2021-08-08 LAB — HCV RNA,QUANTITATIVE REAL TIME PCR
HCV Quantitative Log: 7.02 Log IU/mL — ABNORMAL HIGH
HCV RNA, PCR, QN: 10400000 IU/mL — ABNORMAL HIGH

## 2021-08-08 LAB — HEPATITIS C ANTIBODY
Hepatitis C Ab: REACTIVE — AB
SIGNAL TO CUT-OFF: >11 — ABNORMAL HIGH (ref <1.00)

## 2021-08-08 NOTE — Progress Notes (Signed)
Orders Placed This Encounter  Procedures   Ambulatory referral to Infectious Disease    Referral Priority:   Urgent    Referral Type:   Consultation    Referral Reason:   Specialty Services Required    Referred to Provider:   Lynn Ito, MD    Requested Specialty:   Infectious Diseases    Number of Visits Requested:   1   Sending to infectious disease discussed with Dr. Rivka Safer.  Please let patient know.

## 2021-08-09 ENCOUNTER — Telehealth: Payer: Self-pay | Admitting: *Deleted

## 2021-08-09 LAB — HEPATITIS C GENOTYPE

## 2021-08-09 LAB — HEPATITIS B SURFACE ANTIGEN: Hepatitis B Surface Ag: NONREACTIVE

## 2021-08-09 LAB — HEPATITIS A ANTIBODY, TOTAL: Hepatitis A AB,Total: NONREACTIVE

## 2021-08-09 LAB — HIV ANTIBODY (ROUTINE TESTING W REFLEX): HIV 1&2 Ab, 4th Generation: NONREACTIVE

## 2021-08-09 LAB — HEPATITIS B SURFACE ANTIBODY,QUALITATIVE: Hep B S Ab: NONREACTIVE

## 2021-08-09 NOTE — Telephone Encounter (Signed)
-----   Message from Berniece Pap, FNP sent at 08/08/2021 10:43 PM EST ----- Marcus Valenzuela please let patient know infectious disease will be calling him to have treatment for hepatitis C  Referral was placed, he should still see GI as well for stomach discomfort.

## 2021-08-09 NOTE — Telephone Encounter (Signed)
infectious disease will be calling him to have treatment for hepatitis C  Referral was placed, he should still see GI as well for stomach discomfort.

## 2021-08-09 NOTE — Progress Notes (Signed)
HIV is negative

## 2021-08-13 ENCOUNTER — Telehealth: Payer: Self-pay

## 2021-08-13 NOTE — Telephone Encounter (Signed)
Placed call to pt to let him know referral was placed to infectious disease for his Hepatitis C and he should keep his GI referral as well. LMTCB

## 2021-08-15 ENCOUNTER — Other Ambulatory Visit: Payer: Self-pay

## 2021-08-15 ENCOUNTER — Encounter: Payer: Self-pay | Admitting: Infectious Diseases

## 2021-08-15 ENCOUNTER — Ambulatory Visit: Payer: Self-pay | Attending: Infectious Diseases | Admitting: Infectious Diseases

## 2021-08-15 VITALS — BP 133/87 | HR 68 | Resp 17 | Ht 65.8 in | Wt 182.0 lb

## 2021-08-15 DIAGNOSIS — B1920 Unspecified viral hepatitis C without hepatic coma: Secondary | ICD-10-CM | POA: Insufficient documentation

## 2021-08-15 DIAGNOSIS — K760 Fatty (change of) liver, not elsewhere classified: Secondary | ICD-10-CM | POA: Insufficient documentation

## 2021-08-15 DIAGNOSIS — F17210 Nicotine dependence, cigarettes, uncomplicated: Secondary | ICD-10-CM | POA: Insufficient documentation

## 2021-08-15 DIAGNOSIS — B182 Chronic viral hepatitis C: Secondary | ICD-10-CM

## 2021-08-15 DIAGNOSIS — F1090 Alcohol use, unspecified, uncomplicated: Secondary | ICD-10-CM | POA: Insufficient documentation
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
## 2021-08-15 NOTE — Patient Instructions (Addendum)
You have been referred to me for hepc treatment- most of the blood test has bene done by your PCP- you need to get ultrasound of the liver, and couple of other blood test- since you have quit drinking alcohol we can check liver test in 2 weeks I have ordered some blood work and ultrasound to e obtained in the next 2 weeks- will apply for medication for hepc You may be taking epclusa as treatment for hepc in the future

## 2021-08-21 ENCOUNTER — Telehealth: Payer: Self-pay

## 2021-08-21 NOTE — Telephone Encounter (Signed)
FYI: Scheduled patient for U/S Elastography at [facility] 08/27/21 9 am. NPO after midnight. Advised his wife Bryson Ha of this appt and all related instructions. Also reminded her to contact Medicaid and ask about one time diagnosis related Medicaid. I did agree to call her if I learn of any other financial assistance programs he could qualify for.

## 2021-08-27 ENCOUNTER — Ambulatory Visit
Admission: RE | Admit: 2021-08-27 | Discharge: 2021-08-27 | Disposition: A | Discharge status: Home / Self Care | Payer: Self-pay | Source: Ambulatory Visit | Attending: Infectious Diseases | Admitting: Infectious Diseases

## 2021-08-27 ENCOUNTER — Other Ambulatory Visit: Payer: Self-pay

## 2021-08-27 ENCOUNTER — Other Ambulatory Visit
Admission: RE | Admit: 2021-08-27 | Discharge: 2021-08-27 | Disposition: A | Discharge status: Home / Self Care | Payer: Self-pay | Source: Home / Self Care | Attending: Infectious Diseases | Admitting: Infectious Diseases

## 2021-08-27 DIAGNOSIS — B182 Chronic viral hepatitis C: Secondary | ICD-10-CM | POA: Insufficient documentation

## 2021-08-27 LAB — HEPATIC FUNCTION PANEL
ALT: 40 U/L (ref 0–44)
AST: 33 U/L (ref 15–41)
Albumin: 3.9 g/dL (ref 3.5–5.0)
Alkaline Phosphatase: 63 U/L (ref 38–126)
Bilirubin, Direct: <0.1 mg/dL (ref 0.0–0.2)
Total Bilirubin: 0.4 mg/dL (ref 0.3–1.2)
Total Protein: 7.8 g/dL (ref 6.5–8.1)

## 2021-08-27 LAB — APTT: aPTT: 30 seconds (ref 24–36)

## 2021-08-27 LAB — PROTIME-INR
INR: 1.1 (ref 0.8–1.2)
Prothrombin Time: 13.8 seconds (ref 11.4–15.2)

## 2021-08-28 LAB — AFP TUMOR MARKER: AFP, Serum, Tumor Marker: 1.9 ng/mL (ref 0.0–8.4)

## 2021-09-02 ENCOUNTER — Ambulatory Visit (INDEPENDENT_AMBULATORY_CARE_PROVIDER_SITE_OTHER): Payer: Self-pay | Admitting: Gastroenterology

## 2021-09-02 ENCOUNTER — Encounter: Payer: Self-pay | Admitting: Infectious Diseases

## 2021-09-02 ENCOUNTER — Encounter: Payer: Self-pay | Admitting: Gastroenterology

## 2021-09-02 ENCOUNTER — Other Ambulatory Visit: Payer: Self-pay

## 2021-09-02 VITALS — BP 133/84 | HR 89 | Temp 98.2°F | Ht 67.0 in | Wt 181.2 lb

## 2021-09-02 DIAGNOSIS — Z8719 Personal history of other diseases of the digestive system: Secondary | ICD-10-CM

## 2021-09-02 DIAGNOSIS — K219 Gastro-esophageal reflux disease without esophagitis: Secondary | ICD-10-CM

## 2021-09-02 DIAGNOSIS — Z23 Encounter for immunization: Secondary | ICD-10-CM

## 2021-09-02 DIAGNOSIS — B182 Chronic viral hepatitis C: Secondary | ICD-10-CM

## 2021-09-02 DIAGNOSIS — K769 Liver disease, unspecified: Secondary | ICD-10-CM

## 2021-09-02 DIAGNOSIS — K7460 Unspecified cirrhosis of liver: Secondary | ICD-10-CM

## 2021-09-02 DIAGNOSIS — Z9889 Other specified postprocedural states: Secondary | ICD-10-CM

## 2021-09-02 DIAGNOSIS — Z800 Family history of malignant neoplasm of digestive organs: Secondary | ICD-10-CM

## 2021-09-02 MED ORDER — NA SULFATE-K SULFATE-MG SULF 17.5-3.13-1.6 GM/177ML PO SOLN: 354.0000 mL | Freq: Once | ORAL | 0 refills | Status: AC

## 2021-09-02 NOTE — Patient Instructions (Signed)
Please purchase a wedge pillow to help you with your acid reflux.  Food Choices for Gastroesophageal Reflux Disease, Adult When you have gastroesophageal reflux disease (GERD), the foods you eat and your eating habits are very important. Choosing the right foods can help ease your discomfort. Think about working with a food expert (dietitian) to help you make good choices. What are tips for following this plan? Reading food labels Look for foods that are low in saturated fat. Foods that may help with your symptoms include: Foods that have less than 5% of daily value (DV) of fat. Foods that have 0 grams of trans fat. Cooking Do not fry your food. Cook your food by baking, steaming, grilling, or broiling. These are all methods that do not need a lot of fat for cooking. To add flavor, try to use herbs that are low in spice and acidity. Meal planning  Choose healthy foods that are low in fat, such as: Fruits and vegetables. Whole grains. Low-fat dairy products. Lean meats, fish, and poultry. Eat small meals often instead of eating 3 large meals each day. Eat your meals slowly in a place where you are relaxed. Avoid bending over or lying down until 2-3 hours after eating. Limit high-fat foods such as fatty meats or fried foods. Limit your intake of fatty foods, such as oils, butter, and shortening. Avoid the following as told by your doctor: Foods that cause symptoms. These may be different for different people. Keep a food diary to keep track of foods that cause symptoms. Alcohol. Drinking a lot of liquid with meals. Eating meals during the 2-3 hours before bed. Lifestyle Stay at a healthy weight. Ask your doctor what weight is healthy for you. If you need to lose weight, work with your doctor to do so safely. Exercise for at least 30 minutes on 5 or more days each week, or as told by your doctor. Wear loose-fitting clothes. Do not smoke or use any products that contain nicotine or  tobacco. If you need help quitting, ask your doctor. Sleep with the head of your bed higher than your feet. Use a wedge under the mattress or blocks under the bed frame to raise the head of the bed. Chew sugar-free gum after meals. What foods should eat? Eat a healthy, well-balanced diet of fruits, vegetables, whole grains, low-fat dairy products, lean meats, fish, and poultry. Each person is different. Foods that may cause symptoms in one person may not cause any symptoms in another person. Work with your doctor to find foods that are safe for you. The items listed above may not be a complete list of what you can eat and drink. Contact a food expert for more options. What foods should I avoid? Limiting some of these foods may help in managing the symptoms of GERD. Everyone is different. Talk with a food expert or your doctor to help you find the exact foods to avoid, if any. Fruits Any fruits prepared with added fat. Any fruits that cause symptoms. For some people, this may include citrus fruits, such as oranges, grapefruit, pineapple, and lemons. Vegetables Deep-fried vegetables. [place] fries. Any vegetables prepared with added fat. Any vegetables that cause symptoms. For some people, this may include tomatoes and tomato products, chili peppers, onions and garlic, and horseradish. Grains Pastries or quick breads with added fat. Meats and other proteins High-fat meats, such as fatty beef or pork, hot dogs, ribs, ham, sausage, salami, and bacon. Fried meat or protein, including fried fish  and fried chicken. Nuts and nut butters, in large amounts. Dairy Whole milk and chocolate milk. Sour cream. Cream. Ice cream. Cream cheese. Milkshakes. Fats and oils Butter. Margarine. Shortening. Ghee. Beverages Coffee and tea, with or without caffeine. Carbonated beverages. Sodas. Energy drinks. Fruit juice made with acidic fruits, such as orange or grapefruit. Tomato juice. Alcoholic drinks. Sweets and  desserts Chocolate and cocoa. Donuts. Seasonings and condiments Pepper. Peppermint and spearmint. Added salt. Any condiments, herbs, or seasonings that cause symptoms. For some people, this may include curry, hot sauce, or vinegar-based salad dressings. The items listed above may not be a complete list of what you should not eat and drink. Contact a food expert for more options. Questions to ask your doctor Diet and lifestyle changes are often the first steps that are taken to manage symptoms of GERD. If diet and lifestyle changes do not help, talk with your doctor about taking medicines. Where to find more information International Foundation for Gastrointestinal Disorders: aboutgerd.org Summary When you have GERD, food and lifestyle choices are very important in easing your symptoms. Eat small meals often instead of 3 large meals a day. Eat your meals slowly and in a place where you are relaxed. Avoid bending over or lying down until 2-3 hours after eating. Limit high-fat foods such as fatty meats or fried foods. This information is not intended to replace advice given to you by your health care provider. Make sure you discuss any questions you have with your health care provider. Document Revised: 02/06/2020 Document Reviewed: 02/06/2020 Elsevier Patient Education  [place].
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
## 2021-09-03 ENCOUNTER — Telehealth: Payer: Self-pay

## 2021-09-03 NOTE — Telephone Encounter (Signed)
Advised plan to stick with GI and we will discuss Hep C treatment plans after procedures with GI.

## 2021-09-05 NOTE — Addendum Note (Signed)
Addended by: Adela Ports on: 09/05/2021 01:03 PM   Modules accepted: Orders

## 2021-09-11 ENCOUNTER — Encounter
Admission: RE | Disposition: A | Discharge status: Home / Self Care | Payer: Self-pay | Source: Ambulatory Visit | Attending: Gastroenterology

## 2021-09-11 ENCOUNTER — Ambulatory Visit: Payer: Self-pay | Admitting: Anesthesiology

## 2021-09-11 ENCOUNTER — Encounter: Payer: Self-pay | Admitting: Gastroenterology

## 2021-09-11 ENCOUNTER — Ambulatory Visit
Admission: RE | Admit: 2021-09-11 | Discharge: 2021-09-11 | Disposition: A | Discharge status: Home / Self Care | Payer: Self-pay | Source: Ambulatory Visit | Attending: Gastroenterology | Admitting: Gastroenterology

## 2021-09-11 DIAGNOSIS — I8510 Secondary esophageal varices without bleeding: Secondary | ICD-10-CM | POA: Insufficient documentation

## 2021-09-11 DIAGNOSIS — K621 Rectal polyp: Secondary | ICD-10-CM | POA: Insufficient documentation

## 2021-09-11 DIAGNOSIS — Z1211 Encounter for screening for malignant neoplasm of colon: Secondary | ICD-10-CM | POA: Insufficient documentation

## 2021-09-11 DIAGNOSIS — B1920 Unspecified viral hepatitis C without hepatic coma: Secondary | ICD-10-CM | POA: Insufficient documentation

## 2021-09-11 DIAGNOSIS — K7460 Unspecified cirrhosis of liver: Secondary | ICD-10-CM | POA: Insufficient documentation

## 2021-09-11 DIAGNOSIS — F17200 Nicotine dependence, unspecified, uncomplicated: Secondary | ICD-10-CM | POA: Insufficient documentation

## 2021-09-11 DIAGNOSIS — Z79899 Other long term (current) drug therapy: Secondary | ICD-10-CM | POA: Insufficient documentation

## 2021-09-11 DIAGNOSIS — K769 Liver disease, unspecified: Secondary | ICD-10-CM

## 2021-09-11 DIAGNOSIS — B182 Chronic viral hepatitis C: Secondary | ICD-10-CM

## 2021-09-11 DIAGNOSIS — K635 Polyp of colon: Secondary | ICD-10-CM | POA: Insufficient documentation

## 2021-09-11 DIAGNOSIS — K219 Gastro-esophageal reflux disease without esophagitis: Secondary | ICD-10-CM | POA: Insufficient documentation

## 2021-09-11 DIAGNOSIS — Z800 Family history of malignant neoplasm of digestive organs: Secondary | ICD-10-CM

## 2021-09-11 HISTORY — PX: COLONOSCOPY WITH PROPOFOL: SHX5780

## 2021-09-11 HISTORY — PX: ESOPHAGOGASTRODUODENOSCOPY: SHX5428

## 2021-09-11 SURGERY — COLONOSCOPY WITH PROPOFOL
Anesthesia: General

## 2021-09-11 MED ORDER — PROPOFOL 500 MG/50ML IV EMUL
INTRAVENOUS | Status: AC
  Filled 2021-09-11: qty 50

## 2021-09-11 MED ORDER — DEXMEDETOMIDINE (PRECEDEX) IN NS 20 MCG/5ML (4 MCG/ML) IV SYRINGE
PREFILLED_SYRINGE | INTRAVENOUS | Status: DC | PRN
  Administered 2021-09-11: 4 ug via INTRAVENOUS
  Administered 2021-09-11: 8 ug via INTRAVENOUS

## 2021-09-11 MED ORDER — PROPOFOL 10 MG/ML IV BOLUS
INTRAVENOUS | Status: DC | PRN
  Administered 2021-09-11: 60 mg via INTRAVENOUS
  Administered 2021-09-11: 10 mg via INTRAVENOUS

## 2021-09-11 MED ORDER — GLYCOPYRROLATE 0.2 MG/ML IJ SOLN
INTRAMUSCULAR | Status: AC
  Filled 2021-09-11: qty 1

## 2021-09-11 MED ORDER — LIDOCAINE HCL (CARDIAC) PF 100 MG/5ML IV SOSY
PREFILLED_SYRINGE | INTRAVENOUS | Status: DC | PRN
  Administered 2021-09-11: 50 mg via INTRAVENOUS

## 2021-09-11 MED ORDER — PROPOFOL 500 MG/50ML IV EMUL
INTRAVENOUS | Status: DC | PRN
  Administered 2021-09-11: 150 ug/kg/min via INTRAVENOUS

## 2021-09-11 MED ORDER — GLYCOPYRROLATE 0.2 MG/ML IJ SOLN
INTRAMUSCULAR | Status: DC | PRN
  Administered 2021-09-11: .2 mg via INTRAVENOUS

## 2021-09-11 MED ORDER — STERILE WATER FOR IRRIGATION IR SOLN
Status: DC | PRN
  Administered 2021-09-11: 60 mL

## 2021-09-11 MED ORDER — SODIUM CHLORIDE 0.9 % IV SOLN: INTRAVENOUS | Status: DC
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
## 2021-09-11 NOTE — Op Note (Signed)
[facility]astroenterology Patient Name: Marcus Valenzuela Procedure Date: 09/11/2021 10:08 AM MRN: [id number] Account #: [id number] Date of Birth: [birth year] Admit Type: Outpatient Age: 56 Room: [facility] ENDO ROOM 3 Gender: Male Note Status: Finalized Instrument Name: Prentice Docker [id number] Procedure:             Colonoscopy Indications:           Screening for colorectal malignant neoplasm Providers:             Wyline Mood MD, MD Referring MD:          No Local Md, MD (Referring MD) Medicines:             Monitored Anesthesia Care Complications:         No immediate complications. Procedure:             Pre-Anesthesia Assessment:                        - Prior to the procedure, a History and Physical was                         performed, and patient medications, allergies and                         sensitivities were reviewed. The patient's tolerance                         of previous anesthesia was reviewed.                        - The risks and benefits of the procedure and the                         sedation options and risks were discussed with the                         patient. All questions were answered and informed                         consent was obtained.                        - ASA Grade Assessment: III - A patient with severe                         systemic disease.                        After obtaining informed consent, the colonoscope was                         passed under direct vision. Throughout the procedure,                         the patient's blood pressure, pulse, and oxygen                         saturations were monitored continuously. The                         Colonoscope was  introduced through the anus and                         advanced to the the cecum, identified by the                         appendiceal orifice. The colonoscopy was performed                         with ease. The patient tolerated the procedure well.                          The quality of the bowel preparation was good. Findings:      The perianal and digital rectal examinations were normal.      A 5 mm polyp was found in the ascending colon. The polyp was sessile.       The polyp was removed with a cold snare. Resection and retrieval were       complete.      A 5 mm polyp was found in the descending colon. The polyp was sessile.       The polyp was removed with a cold biopsy forceps. Resection and       retrieval were complete.      Two sessile polyps were found in the rectum. The polyps were 3 to 4 mm       in size. These polyps were removed with a cold biopsy forceps. Resection       and retrieval were complete.      The exam was otherwise without abnormality on direct and retroflexion       views. Impression:            - One 5 mm polyp in the ascending colon, removed with                         a cold snare. Resected and retrieved.                        - One 5 mm polyp in the descending colon, removed with                         a cold biopsy forceps. Resected and retrieved.                        - Two 3 to 4 mm polyps in the rectum, removed with a                         cold biopsy forceps. Resected and retrieved.                        - The examination was otherwise normal on direct and                         retroflexion views. Recommendation:        - Discharge patient to home (with escort).                        - Resume previous diet.                        -  Continue present medications.                        - Await pathology results.                        - Repeat colonoscopy for surveillance based on                         pathology results. Procedure Code(s):     --- Professional ---                        [phone] [id number], Colonoscopy, flexible; with removal of                         tumor(s), polyp(s), or other lesion(s) by snare                         technique                        45380, 59, Colonoscopy,  flexible; with biopsy, single                         or multiple Diagnosis Code(s):     --- Professional ---                        Z12.11, Encounter for screening for malignant neoplasm                         of colon                        K63.5, Polyp of colon                        K62.1, Rectal polyp CPT copyright 2019 American Medical Association. All rights reserved. The codes documented in this report are preliminary and upon coder review may  be revised to meet current compliance requirements. Wyline MoodKiran Brendan Gruwell, MD Wyline MoodKiran Zenas Santa MD, MD 09/11/2021 10:43:57 AM This report has been signed electronically. Number of Addenda: 0 Note Initiated On: 09/11/2021 10:08 AM Scope Withdrawal Time: 0 hours 15 minutes 50 seconds  Total Procedure Duration: 0 hours 16 minutes 57 seconds  Estimated Blood Loss:  Estimated blood loss: none.      [facility]

## 2021-09-11 NOTE — Op Note (Signed)
[facility] Gastroenterology Patient Name: Marcus Valenzuela Procedure Date: 09/11/2021 10:08 AM MRN: [id number] Account #: [id number] Date of Birth: 1965/09/08 Admit Type: Outpatient Age: 56 Room: [facility] ENDO ROOM 3 Gender: Male Note Status: Finalized Instrument Name: Upper Endoscope [id number] Procedure:             Upper GI endoscopy Indications:           Cirrhosis rule out esophageal varices Providers:             Jonathon Bellows MD, MD Referring MD:          No Local Md, MD (Referring MD) Medicines:             Monitored Anesthesia Care Complications:         No immediate complications. Procedure:             Pre-Anesthesia Assessment:                        - Prior to the procedure, a History and Physical was                         performed, and patient medications, allergies and                         sensitivities were reviewed. The patient's tolerance                         of previous anesthesia was reviewed.                        - The risks and benefits of the procedure and the                         sedation options and risks were discussed with the                         patient. All questions were answered and informed                         consent was obtained.                        - ASA Grade Assessment: III - A patient with severe                         systemic disease.                        After obtaining informed consent, the endoscope was                         passed under direct vision. Throughout the procedure,                         the patient's blood pressure, pulse, and oxygen                         saturations were monitored continuously. The  Endosonoscope was introduced through the mouth, and                         advanced to the third part of duodenum. The upper GI                         endoscopy was accomplished with ease. The patient                         tolerated the procedure  well. Findings:      The examined duodenum was normal.      The stomach was normal.      The cardia and gastric fundus were normal on retroflexion.      Grade I varices were found in the lower third of the esophagus. They       were small in size. Impression:            - Normal examined duodenum.                        - Normal stomach.                        - Grade I esophageal varices.                        - No specimens collected. Recommendation:        - Discharge patient to home (with escort).                        - Resume previous diet.                        - Will start on beta blocker athis follow up to                         preventbleeding from varices Procedure Code(s):     --- Professional ---                        [phone], Esophagogastroduodenoscopy, flexible,                         transoral; diagnostic, including collection of                         specimen(s) by brushing or washing, when performed                         (separate procedure) Diagnosis Code(s):     --- Professional ---                        K74.60, Unspecified cirrhosis of liver                        I85.10, Secondary esophageal varices without bleeding CPT copyright 2019 American Medical Association. All rights reserved. The codes documented in this report are preliminary and upon coder review may  be revised to meet current compliance requirements. Jonathon Bellows, MD Jonathon Bellows MD, MD 09/11/2021 10:20:51 AM This report has been signed electronically. Number of Addenda: 0 Note  Initiated On: 09/11/2021 10:08 AM Estimated Blood Loss:  Estimated blood loss: none.      [facility]

## 2021-09-11 NOTE — Transfer of Care (Signed)
Immediate Anesthesia Transfer of Care Note  Patient: Marcus Valenzuela  Procedure(s) Performed: COLONOSCOPY WITH PROPOFOL ESOPHAGOGASTRODUODENOSCOPY (EGD)  Patient Location: PACU and Endoscopy Unit  Anesthesia Type:General  Level of Consciousness: drowsy and patient cooperative  Airway & Oxygen Therapy: Patient Spontanous Breathing  Post-op Assessment: Report given to RN and Post -op Vital signs reviewed and stable  Post vital signs: Reviewed and stable  Last Vitals:  Vitals Value Taken Time  BP 100/68 09/11/21 1045  Temp 35.7 C 09/11/21 1043  Pulse 78 09/11/21 1046  Resp 23 09/11/21 1046  SpO2 93 % 09/11/21 1046  Vitals shown include unvalidated device data.  Last Pain:  Vitals:   09/11/21 1045  TempSrc:   PainSc: Asleep         Complications: No notable events documented.
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
## 2021-09-11 NOTE — Anesthesia Procedure Notes (Signed)
Procedure Name: MAC Date/Time: 09/11/2021 10:18 AM Performed by: Jerrye Noble, CRNA Pre-anesthesia Checklist: Patient identified, Emergency Drugs available, Suction available and Patient being monitored Patient Re-evaluated:Patient Re-evaluated prior to induction Oxygen Delivery Method: Nasal cannula

## 2021-09-12 LAB — SURGICAL PATHOLOGY

## 2021-09-12 NOTE — Anesthesia Postprocedure Evaluation (Signed)
Anesthesia Post Note  Patient: Marcus Valenzuela  Procedure(s) Performed: COLONOSCOPY WITH PROPOFOL ESOPHAGOGASTRODUODENOSCOPY (EGD)  Patient location during evaluation: PACU Anesthesia Type: General Level of consciousness: awake and alert Pain management: pain level controlled Vital Signs Assessment: post-procedure vital signs reviewed and stable Respiratory status: spontaneous breathing, nonlabored ventilation, respiratory function stable and patient connected to nasal cannula oxygen Cardiovascular status: blood pressure returned to baseline and stable Postop Assessment: no apparent nausea or vomiting Anesthetic complications: no   No notable events documented.   Last Vitals:  Vitals:   09/11/21 1103 09/11/21 1113  BP: (!) 81/68 122/80  Pulse: 68 65  Resp: 15 19  Temp:    SpO2: 92% 97%    Last Pain:  Vitals:   09/12/21 0737  TempSrc:   PainSc: 0-No pain                 Molli Barrows

## 2021-09-12 NOTE — Progress Notes (Signed)
Pathology report Colon and rectal polyps negative for malignancy and any dysplasia, gastroenterology should call him with results.   DIAGNOSIS:  A. COLON POLYP, ASCENDING; COLD SNARE:  - HYPERPLASTIC POLYP.  - NEGATIVE FOR DYSPLASIA AND MALIGNANCY.   B. COLON POLYP, DESCENDING; COLD BIOPSY:  - HYPERPLASTIC POLYP.  - NEGATIVE FOR DYSPLASIA AND MALIGNANCY.   C. RECTUM POLYP; COLD BIOPSY:  - HYPERPLASTIC POLYP.  - NEGATIVE FOR DYSPLASIA AND MALIGNANCY

## 2021-09-16 ENCOUNTER — Encounter: Payer: Self-pay | Admitting: Gastroenterology

## 2021-09-19 ENCOUNTER — Other Ambulatory Visit (HOSPITAL_COMMUNITY): Payer: Self-pay

## 2021-09-19 ENCOUNTER — Telehealth: Payer: Self-pay

## 2021-09-19 NOTE — Telephone Encounter (Signed)
E-mail is fine

## 2021-09-19 NOTE — Telephone Encounter (Signed)
Patient's wife called to follow up regarding what patient needs to to do to move forward with Hep C treatment. Patient has seen GI and had a colonoscopy done last week. Please advise when patient needs to scheduled for follow up to discuss treatment plan. Lashaya Kienitz T Pricilla Loveless

## 2021-09-19 NOTE — Telephone Encounter (Signed)
Would you want me to email you the application? I already have them printed out and filled out.

## 2021-09-19 NOTE — Telephone Encounter (Signed)
Patient is scheduled to see Dr. Joylene Draft on 09/24/21 to discuss Hep C treatment

## 2021-09-24 ENCOUNTER — Encounter: Payer: Self-pay | Admitting: Infectious Diseases

## 2021-09-24 ENCOUNTER — Other Ambulatory Visit: Payer: Self-pay

## 2021-09-24 ENCOUNTER — Ambulatory Visit: Payer: Self-pay | Attending: Infectious Diseases | Admitting: Infectious Diseases

## 2021-09-24 VITALS — BP 123/78 | HR 74 | Temp 97.6°F | Wt 177.0 lb

## 2021-09-24 DIAGNOSIS — F1090 Alcohol use, unspecified, uncomplicated: Secondary | ICD-10-CM | POA: Insufficient documentation

## 2021-09-24 DIAGNOSIS — I8500 Esophageal varices without bleeding: Secondary | ICD-10-CM | POA: Insufficient documentation

## 2021-09-24 DIAGNOSIS — Z86010 Personal history of colonic polyps: Secondary | ICD-10-CM | POA: Insufficient documentation

## 2021-09-24 DIAGNOSIS — F17210 Nicotine dependence, cigarettes, uncomplicated: Secondary | ICD-10-CM | POA: Insufficient documentation

## 2021-09-24 DIAGNOSIS — B182 Chronic viral hepatitis C: Secondary | ICD-10-CM

## 2021-09-24 DIAGNOSIS — B1920 Unspecified viral hepatitis C without hepatic coma: Secondary | ICD-10-CM | POA: Insufficient documentation
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
## 2021-09-24 NOTE — Patient Instructions (Signed)
Today we reviewed the paper work that has to be  submitted  for the medication. With all the tests completed the paper work has been faxed to the company for charity medication. Once approved it will be mailed to your home

## 2021-09-26 ENCOUNTER — Ambulatory Visit: Payer: Self-pay | Admitting: Gastroenterology

## 2021-09-30 ENCOUNTER — Other Ambulatory Visit: Payer: Self-pay | Admitting: Infectious Diseases

## 2021-09-30 MED ORDER — SOFOSBUVIR-VELPATASVIR 400-100 MG PO TABS: 1.0000 | ORAL_TABLET | Freq: Every day | ORAL | 2 refills | Status: DC

## 2021-09-30 NOTE — Progress Notes (Signed)
Pt has been approved for Epclusa thru patient assistance program - Sofasbuvair/velpatesevir- 400mg +100mg  Qd for 12 weeks- prescription will be faxed to the companyby 

## 2021-10-01 ENCOUNTER — Other Ambulatory Visit: Payer: Self-pay | Admitting: Pharmacist

## 2021-10-01 DIAGNOSIS — B182 Chronic viral hepatitis C: Secondary | ICD-10-CM

## 2021-10-01 MED ORDER — SOFOSBUVIR-VELPATASVIR 400-100 MG PO TABS: 1.0000 | ORAL_TABLET | Freq: Every day | ORAL | 2 refills | Status: DC

## 2021-10-02 ENCOUNTER — Telehealth: Payer: Self-pay

## 2021-10-02 NOTE — Telephone Encounter (Signed)
[facility] Patient Advocate Encounter  Completed and sent Support Path application for Epclusa for this patient who is uninsured.    Patient is approved 10/02/21 through 01/30/22.  Medication will be shipped to the clinic ([facility]).  I faxed hardcopy script to [facility]. #[phone]   Clearance Coots, CPhT Specialty Pharmacy Patient Advocate [facility] for Infectious Disease Phone: [phone] Fax:  [phone]

## 2021-10-04 ENCOUNTER — Telehealth: Payer: Self-pay

## 2021-10-04 ENCOUNTER — Telehealth: Payer: Self-pay | Admitting: Pharmacist

## 2021-10-04 NOTE — Telephone Encounter (Signed)
[facility] Patient Advocate Encounter  Patient's medications have been couriered to [facility] from [employer] and will be picked up 10/04/21.  Clearance Coots , CPhT Specialty Pharmacy Patient [facility] for Infectious Disease Phone: [phone] Fax:  [phone]

## 2021-10-04 NOTE — Telephone Encounter (Signed)
Patient is approved to receive Epclusa x 12 weeks for chronic Hepatitis C infection. Counseled patient to take Epclusa daily with or without food. Encouraged patient not to miss any doses and explained how their chance of cure could go down with each dose missed. Counseled patient on what to do if dose is missed - if it is closer to the missed dose take immediately; if closer to next dose skip dose and take the next dose at the usual time. Counseled patient on common side effects such as headache, fatigue, and nausea and that these normally decrease with time. I reviewed patient medications and found no drug interactions. Discussed with patient that there are several drug interactions including acid suppressants. Instructed patient to call clinic if he wishes to start a new medication during course of therapy. Also advised patient to call if he experiences any side effects. Patient will follow-up with Dr. Rivka Safer on 3/30.  Margarite Gouge, PharmD, CPP Clinical Pharmacist Practitioner Infectious Diseases Clinical Pharmacist [facility] for Infectious Disease

## 2021-10-23 ENCOUNTER — Telehealth: Payer: Self-pay

## 2021-10-23 NOTE — Telephone Encounter (Signed)
[facility] Patient Advocate Encounter ? ?Patient's medication (Epclsua)have been couriered to [facility] from [employer] and will be picked up 10/24/21. ? ?Ileene Patrick , CPhT ?Specialty Pharmacy Patient Advocate ?[place] for Infectious Disease ?Phone: [phone] ?Fax:  [phone]  ?

## 2021-10-29 ENCOUNTER — Ambulatory Visit: Payer: Self-pay | Admitting: Adult Health

## 2021-11-07 ENCOUNTER — Ambulatory Visit: Payer: Self-pay | Attending: Infectious Diseases | Admitting: Infectious Diseases

## 2021-11-07 ENCOUNTER — Encounter: Payer: Self-pay | Admitting: Infectious Diseases

## 2021-11-07 VITALS — BP 127/85 | HR 74 | Temp 97.5°F | Wt 172.0 lb

## 2021-11-07 DIAGNOSIS — F17210 Nicotine dependence, cigarettes, uncomplicated: Secondary | ICD-10-CM

## 2021-11-07 DIAGNOSIS — B182 Chronic viral hepatitis C: Secondary | ICD-10-CM

## 2021-11-07 NOTE — Patient Instructions (Signed)
You have completed 33 days of treatment for HEPC and doing well- we can get labs afer another month of rx- will see you back in 3 months ?
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
## 2021-11-18 ENCOUNTER — Telehealth: Payer: Self-pay

## 2021-11-18 NOTE — Telephone Encounter (Signed)
[facility] Patient Advocate Encounter ? ?Patient's medications have been couriered to [facility] from [employer] and will be picked up 11/19/21. ? ?3rd Box (Last) of Epclusa  ? ?Clearance Coots , CPhT ?Specialty Pharmacy Patient Advocate ?[facility] ?Phone: [phone] ?Fax:  [phone]  ?

## 2022-02-06 ENCOUNTER — Encounter: Payer: Self-pay | Admitting: Infectious Diseases

## 2022-02-06 ENCOUNTER — Ambulatory Visit: Payer: Self-pay | Attending: Infectious Diseases | Admitting: Infectious Diseases

## 2022-02-06 ENCOUNTER — Other Ambulatory Visit
Admission: RE | Admit: 2022-02-06 | Discharge: 2022-02-06 | Disposition: A | Discharge status: Home / Self Care | Payer: HRSA Program | Attending: Infectious Diseases | Admitting: Infectious Diseases

## 2022-02-06 VITALS — BP 149/89 | HR 81 | Temp 97.0°F | Ht 66.0 in | Wt 171.0 lb

## 2022-02-06 DIAGNOSIS — Z8619 Personal history of other infectious and parasitic diseases: Secondary | ICD-10-CM | POA: Insufficient documentation

## 2022-02-06 DIAGNOSIS — B182 Chronic viral hepatitis C: Secondary | ICD-10-CM | POA: Insufficient documentation

## 2022-02-06 DIAGNOSIS — F17210 Nicotine dependence, cigarettes, uncomplicated: Secondary | ICD-10-CM | POA: Insufficient documentation

## 2022-02-06 DIAGNOSIS — Z23 Encounter for immunization: Secondary | ICD-10-CM | POA: Insufficient documentation

## 2022-02-06 DIAGNOSIS — G4700 Insomnia, unspecified: Secondary | ICD-10-CM | POA: Insufficient documentation

## 2022-02-06 LAB — CBC WITH DIFFERENTIAL/PLATELET
Abs Immature Granulocytes: 0.06 K/uL (ref 0.00–0.07)
Basophils Absolute: 0.1 K/uL (ref 0.0–0.1)
Basophils Relative: 1 %
Eosinophils Absolute: 0.2 K/uL (ref 0.0–0.5)
Eosinophils Relative: 1 %
HCT: 50.1 % (ref 39.0–52.0)
Hemoglobin: 16.8 g/dL (ref 13.0–17.0)
Immature Granulocytes: 0 %
Lymphocytes Relative: 21 %
Lymphs Abs: 3.2 K/uL (ref 0.7–4.0)
MCH: 30.9 pg (ref 26.0–34.0)
MCHC: 33.5 g/dL (ref 30.0–36.0)
MCV: 92.3 fL (ref 80.0–100.0)
Monocytes Absolute: 1 K/uL (ref 0.1–1.0)
Monocytes Relative: 6 %
Neutro Abs: 10.7 K/uL — ABNORMAL HIGH (ref 1.7–7.7)
Neutrophils Relative %: 71 %
Platelets: 205 K/uL (ref 150–400)
RBC: 5.43 MIL/uL (ref 4.22–5.81)
RDW: 13.8 % (ref 11.5–15.5)
WBC: 15.2 K/uL — ABNORMAL HIGH (ref 4.0–10.5)
nRBC: 0 % (ref 0.0–0.2)

## 2022-02-06 LAB — COMPREHENSIVE METABOLIC PANEL WITH GFR
ALT: 13 U/L (ref 0–44)
AST: 19 U/L (ref 15–41)
Albumin: 4 g/dL (ref 3.5–5.0)
Alkaline Phosphatase: 74 U/L (ref 38–126)
Anion gap: 8 (ref 5–15)
BUN: 11 mg/dL (ref 6–20)
CO2: 23 mmol/L (ref 22–32)
Calcium: 9.7 mg/dL (ref 8.9–10.3)
Chloride: 109 mmol/L (ref 98–111)
Creatinine, Ser: 0.76 mg/dL (ref 0.61–1.24)
GFR, Estimated: >60 mL/min
Glucose, Bld: 85 mg/dL (ref 70–99)
Potassium: 3.9 mmol/L (ref 3.5–5.1)
Sodium: 140 mmol/L (ref 135–145)
Total Bilirubin: 0.4 mg/dL (ref 0.3–1.2)
Total Protein: 8.3 g/dL — ABNORMAL HIGH (ref 6.5–8.1)
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
## 2022-02-06 NOTE — Patient Instructions (Signed)
You are here for follow up of hepc- you completed treatment and we will do labs today You will get your 2nd dose of hepa /hepb vaccine Follow up 6 months

## 2022-02-07 LAB — HCV RNA QUANT: HCV Quantitative: NOT DETECTED IU/mL (ref >50)

## 2022-02-10 ENCOUNTER — Telehealth: Payer: Self-pay

## 2022-02-10 NOTE — Telephone Encounter (Signed)
Attempted to call patient regarding lab results. Not able to reach him at this time. Left voicemail requesting call back. Will send mychart message. Juanita Laster, RMA

## 2022-02-10 NOTE — Telephone Encounter (Signed)
-----   Message from Lynn Ito, MD sent at 02/10/2022  1:44 PM EDT ----- Can you please let him know that HEPC RNA is undetectable and the treatment he took worked- Next test in 6 months. Thx  ----- Message ----- From: Leory Plowman, Lab [facility] [place] Sent: 02/06/2022   9:50 AM EDT To: Lynn Ito, MD

## 2022-03-03 ENCOUNTER — Ambulatory Visit (INDEPENDENT_AMBULATORY_CARE_PROVIDER_SITE_OTHER): Payer: Self-pay | Admitting: Gastroenterology

## 2022-03-03 ENCOUNTER — Encounter: Payer: Self-pay | Admitting: Gastroenterology

## 2022-03-03 VITALS — BP 129/75 | HR 69 | Temp 98.1°F | Wt 172.2 lb

## 2022-03-03 DIAGNOSIS — Z23 Encounter for immunization: Secondary | ICD-10-CM

## 2022-03-03 DIAGNOSIS — K769 Liver disease, unspecified: Secondary | ICD-10-CM

## 2022-03-03 DIAGNOSIS — B182 Chronic viral hepatitis C: Secondary | ICD-10-CM

## 2022-03-03 DIAGNOSIS — K7460 Unspecified cirrhosis of liver: Secondary | ICD-10-CM

## 2022-03-03 DIAGNOSIS — K219 Gastro-esophageal reflux disease without esophagitis: Secondary | ICD-10-CM

## 2022-03-03 NOTE — Progress Notes (Signed)
Wyline Mood MD, MRCP(U.K) [address]  [address]  [place], [place] [address]  Main: [phone]  Fax: [phone]   Primary Care Physician: Berniece Pap, FNP  Primary Gastroenterologist:  Dr. Wyline Mood   Chief Complaint  Patient presents with   Cirrhosis    HPI: Marcus Valenzuela is a 56 y.o. male   Summary of history :  Marcus Valenzuela is a 56 y.o. y/o male seen initially in 08/2021 for GERD.  , hepatitis C, history of excess alcohol use abnormal LFTs.  Creatinine 0.79 and total bilirubin of 0.6 genotype Ia hepatitis C viral load of 7.7 million.  He underwent ultrasound elastography of the liver that shows features of chronic liver disease has his K PA is greater than 9.  HIV negative, hepatitis B surface antibody nonreactive and hepatitis A total antibody nonreactive.    Has consumed alcohol on a daily basis for over 15 to 20 years.  He has had professional tattoos.  No Financial planner.  No blood transfusions.  Denies any blood in the stool at this time.  Marland Kitchen  He has been started on omeprazole 20 mg twice a day for reflux.    Interval history   09/02/2021-03/03/2022  09/11/2021: EGD: Grade 1 small esophageal varices seen not banded - started on Beta blockers Colonoscopy: 4 sessile small polyps resected. Hyperplastic.   S/p Rx with Epclusa in 12/2021 by Dr. Rivka Safer  01/17/2022 hepatitis C virus not detected. 02/06/2022 LFTs normalized, hemoglobin 16.8 g He is doing well stop drinking all alcohol in December.  No new complaints he says he received some immunizations including hepatitis a and B which has not available for me to see on epic we will cross check.  He has not had a shingles vaccine. No current outpatient medications on file.   No current facility-administered medications for this visit.    Allergies as of 03/03/2022 - Review Complete 03/03/2022  Allergen Reaction Noted   Hydromorphone Hives 01/23/2020    ROS:  General: Negative for  anorexia, weight loss, fever, chills, fatigue, weakness. ENT: Negative for hoarseness, difficulty swallowing , nasal congestion. CV: Negative for chest pain, angina, palpitations, dyspnea on exertion, peripheral edema.  Respiratory: Negative for dyspnea at rest, dyspnea on exertion, cough, sputum, wheezing.  GI: See history of present illness. GU:  Negative for dysuria, hematuria, urinary incontinence, urinary frequency, nocturnal urination.  Endo: Negative for unusual weight change.    Physical Examination:   BP 129/75   Pulse 69   Temp 98.1 F (36.7 C) (Oral)   Wt 172 lb 3.2 oz (78.1 kg)   BMI 27.79 kg/m   General: Well-nourished, well-developed in no acute distress.  Eyes: No icterus. Conjunctivae pink. Mouth: Oropharyngeal mucosa moist and pink , no lesions erythema or exudate. Neuro: Alert and oriented x 3.  Grossly intact. Skin: Warm and dry, no jaundice.   Psych: Alert and cooperative, normal mood and affect.   Imaging Studies: No results found. Computed MELD 3.0 unavailable. Necessary lab results were not found in the last year. Computed MELD-Na unavailable. Necessary lab results were not found in the last year.   Assessment and Plan:   Marcus Valenzuela is a 56 y.o. y/o male here to follow up for chronic liver disease with possible cirrhosis on recent imaging.  Likely due to effects of long-term alcohol consumption and chronic hepatitis C status posttreatment with Epclusa in May 2023 and viral load not detected in June 2023.  He also  has a history of reflux and has been commenced on omeprazole 20 mg twice daily.  He has a family history of colon cancer in his brother and is due for a colonoscopy which she has never had before     Plan 1.  EGD in 2026 janurary  2.  RUQ USG to screen for hcc with AFP 3.  Not immune to hepatitis A and B requires vaccination series to be completed, pneumococcal vaccination, Tdap, shingles vaccine as he is over the age of 83 4.   Continue to stay off all alcohol 5.  Check INR to calculate MELD score 6.  Presently not on any PPI.    Dr Wyline Mood  MD,MRCP [facility]) Follow up in 6 months

## 2022-03-03 NOTE — Patient Instructions (Signed)
Please remember to not eat or drink after midnight the night before. Please arrive 30 minutes before your ultrasound appointment.

## 2022-03-04 LAB — AFP TUMOR MARKER: AFP, Serum, Tumor Marker: <1.8 ng/mL (ref 0.0–8.4)

## 2022-03-04 LAB — PROTIME-INR
INR: 1 (ref 0.9–1.2)
Prothrombin Time: 10.3 s (ref 9.1–12.0)

## 2022-03-11 ENCOUNTER — Ambulatory Visit: Payer: Self-pay

## 2022-08-07 ENCOUNTER — Ambulatory Visit: Payer: Self-pay | Admitting: Infectious Diseases

## 2023-06-25 ENCOUNTER — Telehealth: Payer: Self-pay

## 2023-06-25 NOTE — Telephone Encounter (Signed)
Wife reached out to me and requested office visit stated patient is having many issues and has hx HEP C treated in July 2023. Advised will need referral and she will contact PCP for labs and other work up to rule out other issues and then be referred to [facility] ID if needed by PCP.
